# Patient Record
Sex: Female | Born: 1955
Health system: Southern US, Community
[De-identification: ages and names within clinical notes are randomized; demographics above are authoritative.]

## PROBLEM LIST (undated history)

## (undated) DIAGNOSIS — F329 Major depressive disorder, single episode, unspecified: Principal | ICD-10-CM

## (undated) DIAGNOSIS — G44229 Chronic tension-type headache, not intractable: Secondary | ICD-10-CM

## (undated) DIAGNOSIS — F4323 Adjustment disorder with mixed anxiety and depressed mood: Secondary | ICD-10-CM

## (undated) HISTORY — DX: Adjustment disorder with mixed anxiety and depressed mood: F43.23

## (undated) HISTORY — DX: Chronic tension-type headache, not intractable: G44.229

## (undated) HISTORY — DX: Major depressive disorder, single episode, unspecified: F32.9

---
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
## 1987-12-10 HISTORY — PX: BREAST ENHANCEMENT SURGERY: SHX7

## 2001-12-09 HISTORY — PX: OTHER SURGICAL HISTORY: SHX169

## 2011-07-01 DIAGNOSIS — F32A Depression, unspecified: Secondary | ICD-10-CM

## 2011-07-01 HISTORY — DX: Depression, unspecified: F32.A

## 2011-09-24 ENCOUNTER — Other Ambulatory Visit: Payer: Self-pay | Admitting: Obstetrics and Gynecology

## 2011-09-24 DIAGNOSIS — Z1231 Encounter for screening mammogram for malignant neoplasm of breast: Secondary | ICD-10-CM

## 2011-10-24 ENCOUNTER — Ambulatory Visit
Admission: RE | Admit: 2011-10-24 | Discharge: 2011-10-24 | Disposition: A | Discharge status: Home / Self Care | Payer: BC Managed Care – PPO | Source: Ambulatory Visit | Attending: Obstetrics and Gynecology | Admitting: Obstetrics and Gynecology

## 2011-10-24 DIAGNOSIS — Z1231 Encounter for screening mammogram for malignant neoplasm of breast: Secondary | ICD-10-CM

## 2012-09-25 ENCOUNTER — Encounter: Payer: Self-pay | Admitting: Obstetrics and Gynecology

## 2012-09-25 ENCOUNTER — Ambulatory Visit (INDEPENDENT_AMBULATORY_CARE_PROVIDER_SITE_OTHER): Payer: BC Managed Care – PPO | Admitting: Obstetrics and Gynecology

## 2012-09-25 VITALS — BP 120/80 | HR 72 | Resp 16 | Ht 64.0 in | Wt 159.0 lb

## 2012-09-25 DIAGNOSIS — Z01419 Encounter for gynecological examination (general) (routine) without abnormal findings: Secondary | ICD-10-CM

## 2012-09-25 NOTE — Progress Notes (Signed)
Contraception Postmenopausal Last pap 09/24/2011 WNL Last Mammo 10/24/2011 WNL Last Colonoscopy 2001 WNL (Polyps-benign) Last Dexa Scan None Primary MD Pennelope Bracken Abuse at Home None   Filed Vitals:   09/25/12 1015  BP: 120/80  Pulse: 72  Resp: 16   ROS: noncontributory  Physical Examination: General appearance - alert, well appearing, and in no distress Neck - supple, no significant adenopathy Chest - clear to auscultation, no wheezes, rales or rhonchi, symmetric air entry Heart - normal rate and regular rhythm Abdomen - soft, nontender, nondistended, no masses or organomegaly Breasts - breasts appear normal, no suspicious masses, no skin or nipple changes or axillary nodes Pelvic - normal external genitalia, vulva, vagina, cervix, uterus and adnexa Rectal - no masses Back exam - no CVAT Extremities - no edema, redness or tenderness in the calves or thighs  A/P No pap today - last pap last yr mammo Refer to Dr Loreta Ave (GI) for coloscopy - last one 12 yrs ago

## 2012-11-13 ENCOUNTER — Telehealth: Payer: Self-pay | Admitting: Obstetrics and Gynecology

## 2012-11-13 NOTE — Telephone Encounter (Signed)
AR pt 

## 2012-11-19 ENCOUNTER — Other Ambulatory Visit: Payer: Self-pay | Admitting: Obstetrics and Gynecology

## 2012-11-19 NOTE — Telephone Encounter (Signed)
Kimberly Vang to address 

## 2012-11-20 NOTE — Telephone Encounter (Signed)
Spoke to pt who has questions about dosing on her hormones. I told her I'd run it by AR on Monday and get back with he with answers to her questions. Melody Comas A

## 2012-11-26 ENCOUNTER — Telehealth: Payer: Self-pay

## 2012-11-26 NOTE — Telephone Encounter (Signed)
Spoke to pt yesterday re: the question she had last week. She wanted to know if she could take 1/2 of her Estradiol and 1/2 of her Provera to see if that would control her mp sx's.  Per AR, she may do this. Pt needs RF's on both meds, in the meantime. 1 RF on both meds was called to patients preferred pharmacy. She will try this for 6-8 weeks and report back how it is working. Melody Comas A

## 2014-06-27 ENCOUNTER — Ambulatory Visit: Payer: 59 | Attending: Specialist

## 2014-06-27 DIAGNOSIS — M25569 Pain in unspecified knee: Secondary | ICD-10-CM | POA: Diagnosis not present

## 2014-06-27 DIAGNOSIS — R262 Difficulty in walking, not elsewhere classified: Secondary | ICD-10-CM | POA: Insufficient documentation

## 2014-06-27 DIAGNOSIS — IMO0001 Reserved for inherently not codable concepts without codable children: Secondary | ICD-10-CM | POA: Diagnosis not present

## 2014-06-30 ENCOUNTER — Ambulatory Visit: Payer: 59 | Admitting: Physical Therapy

## 2014-06-30 DIAGNOSIS — IMO0001 Reserved for inherently not codable concepts without codable children: Secondary | ICD-10-CM | POA: Diagnosis not present

## 2014-07-05 ENCOUNTER — Ambulatory Visit: Payer: 59 | Admitting: Physical Therapy

## 2014-07-05 DIAGNOSIS — IMO0001 Reserved for inherently not codable concepts without codable children: Secondary | ICD-10-CM | POA: Diagnosis not present

## 2014-07-07 ENCOUNTER — Ambulatory Visit: Payer: 59 | Admitting: Physical Therapy

## 2014-07-07 DIAGNOSIS — IMO0001 Reserved for inherently not codable concepts without codable children: Secondary | ICD-10-CM | POA: Diagnosis not present

## 2014-07-12 ENCOUNTER — Ambulatory Visit: Payer: 59 | Attending: Specialist | Admitting: Physical Therapy

## 2014-07-12 DIAGNOSIS — M25569 Pain in unspecified knee: Secondary | ICD-10-CM | POA: Insufficient documentation

## 2014-07-12 DIAGNOSIS — R262 Difficulty in walking, not elsewhere classified: Secondary | ICD-10-CM | POA: Insufficient documentation

## 2014-07-12 DIAGNOSIS — IMO0001 Reserved for inherently not codable concepts without codable children: Secondary | ICD-10-CM | POA: Insufficient documentation

## 2014-07-14 ENCOUNTER — Ambulatory Visit: Payer: 59

## 2014-07-14 DIAGNOSIS — IMO0001 Reserved for inherently not codable concepts without codable children: Secondary | ICD-10-CM | POA: Diagnosis not present

## 2014-07-18 ENCOUNTER — Ambulatory Visit: Payer: 59

## 2014-07-20 ENCOUNTER — Ambulatory Visit: Payer: 59

## 2014-07-20 DIAGNOSIS — IMO0001 Reserved for inherently not codable concepts without codable children: Secondary | ICD-10-CM | POA: Diagnosis not present

## 2014-07-26 ENCOUNTER — Ambulatory Visit: Payer: 59

## 2014-07-26 DIAGNOSIS — IMO0001 Reserved for inherently not codable concepts without codable children: Secondary | ICD-10-CM | POA: Diagnosis not present

## 2014-07-28 ENCOUNTER — Ambulatory Visit: Payer: 59

## 2014-07-28 DIAGNOSIS — IMO0001 Reserved for inherently not codable concepts without codable children: Secondary | ICD-10-CM | POA: Diagnosis not present

## 2014-08-08 ENCOUNTER — Ambulatory Visit: Payer: 59

## 2014-08-08 DIAGNOSIS — IMO0001 Reserved for inherently not codable concepts without codable children: Secondary | ICD-10-CM | POA: Diagnosis not present

## 2014-08-10 ENCOUNTER — Ambulatory Visit: Payer: 59 | Attending: Specialist | Admitting: Rehabilitation

## 2014-08-10 DIAGNOSIS — IMO0001 Reserved for inherently not codable concepts without codable children: Secondary | ICD-10-CM | POA: Diagnosis not present

## 2014-08-10 DIAGNOSIS — R262 Difficulty in walking, not elsewhere classified: Secondary | ICD-10-CM | POA: Diagnosis not present

## 2014-08-10 DIAGNOSIS — M25569 Pain in unspecified knee: Secondary | ICD-10-CM | POA: Diagnosis not present

## 2014-08-16 ENCOUNTER — Ambulatory Visit: Payer: 59

## 2014-08-16 DIAGNOSIS — IMO0001 Reserved for inherently not codable concepts without codable children: Secondary | ICD-10-CM | POA: Diagnosis not present

## 2014-08-18 ENCOUNTER — Ambulatory Visit: Payer: 59

## 2014-11-16 LAB — HM COLONOSCOPY

## 2015-06-23 ENCOUNTER — Encounter: Payer: Self-pay | Admitting: Dietician

## 2015-06-23 ENCOUNTER — Encounter: Payer: 59 | Attending: Family Medicine | Admitting: Dietician

## 2015-06-23 VITALS — Ht 64.5 in | Wt 160.0 lb

## 2015-06-23 DIAGNOSIS — Z713 Dietary counseling and surveillance: Secondary | ICD-10-CM | POA: Diagnosis not present

## 2015-06-23 DIAGNOSIS — I159 Secondary hypertension, unspecified: Secondary | ICD-10-CM | POA: Insufficient documentation

## 2015-06-23 DIAGNOSIS — Z6827 Body mass index (BMI) 27.0-27.9, adult: Secondary | ICD-10-CM | POA: Diagnosis not present

## 2015-06-23 DIAGNOSIS — E663 Overweight: Secondary | ICD-10-CM | POA: Diagnosis not present

## 2015-06-23 NOTE — Progress Notes (Signed)
Medical Nutrition Therapy:  Appt start time: 1000 end time:  1100.   Assessment:  Primary concerns today: Patient is here alone.  She would like to learn how to decrease blood pressure with diet and lose weight. She would also like ideas of things to pack for lunch.  She is in early menopause for the past 2-3 years.  Complains of 8-9 lb weight gain in the past 6 months.  UBW:  150 and feels most comfortable at 140 lbs.  (140 lbs 4-5 years ago.)  Feels like that she is sleeping well currently.    Patient lives with husband and son who is studying for the bar.  All share the cooking and shopping.  She is a controller for a small company.     TANITA  BODY COMP RESULTS 06/23/15   39.6%fat 156.5   BMI (kg/m^2) 26.4   Fat Mass (lbs) 62   Fat Free Mass (lbs) 94.5   Total Body Water (lbs) 69    Preferred Learning Style:   No preference indicated   Learning Readiness:   Ready  MEDICATIONS: see list to also include a beta blocker (pt did not bring meds and can"t remember the name.   DIETARY INTAKE: Patient stated that she felt that she was eating >1500 calories on a regular basis.  She is mindful with her intake overall.  Recently decreased sweet intake.  Has verbalized concerns of the caloric content of her wine intake. 24-hr recall:  B ( AM): 1 slice [employer] bread with almond butter and jelly and diet coke OR Nourish Special K cereal. Snk ( AM): unsweetened tea  L ( PM): From home:  Salad with deli meat and cheese OR chicken salad on lettuce OR cottage cheese and fruit OR out to eat Enchilada, salad, chips and salsa Snk ( PM): occasional Ritz bits PB crackers OR rice krispy treat OR small amount of chocolate D ( PM): sandwich on [employer] with [place], tomato, mayo, avocado, pickled okra, small portion potato chips Snk ( PM): small bowl of ice cream or ice cream sandwich Beverages: 2 glasses of wine 4 nights per week, diet coke, unsweetened tea, water, v-8 fusion on occasion  Usual physical activity:  ADL's.  Has a gym member ship but goes one time every 1-2 weeks and does 30-40 minutes of cycling or walking.  Takes dog for a walk.  Floor exercises 4-5 times per week.    Estimated energy needs: 1500 calories 60-70 gm protein  Progress Towards Goal(s):  In progress.   Nutritional Diagnosis:  NB-1.1 Food and nutrition-related knowledge deficit NB-1.2 Harmful beliefs/attitudes about food or nutrition-related topics (use with caution) As related to weight managment.  As evidenced by patient report and recent weight gain.    Intervention:  Nutrition Education/counseling related to weight management and the importance of increasing physical activity to increase metabolism and for overall health. Discussed basics of intuitive eating.  Also discussed basic low sodium (dash diet) for HTN..  Increase your physical activity.  Continue your floor work.  Find something that you enjoy.  Start the habit.  30 minutes most days is a good start. Continue to make mindful meal choices. Consider what you drink.  Adequate sleep. Decreased sodium intake and increased intake of potassium  (fruit and vegetables)  Lunch ideas to pack:    Fruit and cottage cheese   Salad with veges and protein, red beans  Leftovers  Whole grain crackers, sting cheese, fruit  Greek yogurt with fruit and  2 Tablespoons granola or 6 crackers  Soup and 1/2 sandwich and fruit  Sandwich and fruit   Steamer veges, yogurt, fruit  Raw veges, plain yogurt mixed with ranch seasoning, fruit, 6 whole grain crackers   Teaching Method Utilized:  Visual Auditory  Handouts given during visit include:    Barriers to learning/adherence to lifestyle change: none  Demonstrated degree of understanding via:  Teach Back   Monitoring/Evaluation:  Dietary intake, exercise, and body weight in 1 month(s).

## 2015-06-23 NOTE — Patient Instructions (Addendum)
Increase your physical activity.  Continue your floor work.  Find something that you enjoy.  Start the habit.  30 minutes most days is a good start. Continue to make mindful meal choices. Consider what you drink.  Adequate sleep. Decreased sodium intake and increased intake of potassium  (fruit and vegetables)  Lunch ideas to pack:    Fruit and cottage cheese   Salad with veges and protein, red beans  Leftovers  Whole grain crackers, sting cheese, fruit  Greek yogurt with fruit and 2 Tablespoons granola or 6 crackers  Soup and 1/2 sandwich and fruit  Sandwich and fruit   Steamer veges, yogurt, fruit  Raw veges, plain yogurt mixed with ranch seasoning, fruit, 6 whole grain crackers

## 2015-07-20 LAB — TSH: TSH: 1.88 u[IU]/mL (ref 0.41–5.90)

## 2015-07-20 LAB — HM PAP SMEAR: HM Pap smear: NEGATIVE

## 2015-07-20 LAB — LIPID PANEL
Cholesterol: 218 mg/dL — AB (ref 0–200)
HDL: 64 mg/dL (ref 35–70)
LDL Cholesterol: 133 mg/dL
Triglycerides: 106 mg/dL (ref 40–160)

## 2015-07-20 LAB — CBC AND DIFFERENTIAL
HCT: 44 % (ref 36–46)
Hemoglobin: 14.9 g/dL (ref 12.0–16.0)
Platelets: 240 10*3/uL (ref 150–399)
WBC: 5.6 10^3/mL

## 2015-07-20 LAB — BASIC METABOLIC PANEL
BUN: 14 mg/dL (ref 4–21)
Creatinine: 0.8 mg/dL (ref 0.5–1.1)
Glucose: 95 mg/dL
Potassium: 4.4 mmol/L (ref 3.4–5.3)

## 2015-07-20 LAB — HEPATIC FUNCTION PANEL
ALT: 18 U/L (ref 7–35)
AST: 20 U/L (ref 13–35)

## 2015-07-28 ENCOUNTER — Ambulatory Visit: Payer: 59 | Admitting: Dietician

## 2015-12-27 DIAGNOSIS — R079 Chest pain, unspecified: Secondary | ICD-10-CM | POA: Diagnosis not present

## 2015-12-27 MED FILL — PANTOPRAZOLE SOD DR 40 MG T: 40 | 30 days supply | Qty: 30 | Fill #0

## 2015-12-27 MED FILL — METOPROLOL SUCC ER 25 MG TA: 25 | 90 days supply | Qty: 90 | Fill #0

## 2015-12-28 ENCOUNTER — Other Ambulatory Visit (HOSPITAL_COMMUNITY): Payer: Self-pay | Admitting: Family Medicine

## 2015-12-28 DIAGNOSIS — R0602 Shortness of breath: Secondary | ICD-10-CM

## 2016-01-01 ENCOUNTER — Ambulatory Visit (HOSPITAL_COMMUNITY)
Admission: RE | Admit: 2016-01-01 | Discharge: 2016-01-01 | Disposition: A | Discharge status: Home / Self Care | Payer: 59 | Source: Ambulatory Visit | Attending: Family Medicine | Admitting: Family Medicine

## 2016-01-01 DIAGNOSIS — R9439 Abnormal result of other cardiovascular function study: Secondary | ICD-10-CM | POA: Insufficient documentation

## 2016-01-01 DIAGNOSIS — R0602 Shortness of breath: Secondary | ICD-10-CM | POA: Diagnosis not present

## 2016-01-01 LAB — EXERCISE TOLERANCE TEST
Estimated workload: 8.5 METS
Exercise duration (min): 7 min
Exercise duration (sec): 0 s
MPHR: 161 {beats}/min
Peak HR: 160 {beats}/min
Percent HR: 99 %
RPE: 18
Rest HR: 74 {beats}/min

## 2016-01-01 NOTE — Progress Notes (Signed)
The patient presented for outpatient POET which was ordered by PCP. Dr. Debara Pickett to review.   Kimberly Vang, [place]

## 2016-01-12 ENCOUNTER — Ambulatory Visit (INDEPENDENT_AMBULATORY_CARE_PROVIDER_SITE_OTHER): Payer: 59 | Admitting: Cardiovascular Disease

## 2016-01-12 ENCOUNTER — Encounter: Payer: Self-pay | Admitting: Cardiovascular Disease

## 2016-01-12 VITALS — BP 114/62 | HR 64 | Ht 64.5 in | Wt 167.0 lb

## 2016-01-12 DIAGNOSIS — R9439 Abnormal result of other cardiovascular function study: Secondary | ICD-10-CM | POA: Diagnosis not present

## 2016-01-12 DIAGNOSIS — R079 Chest pain, unspecified: Secondary | ICD-10-CM | POA: Insufficient documentation

## 2016-01-12 DIAGNOSIS — R0789 Other chest pain: Secondary | ICD-10-CM

## 2016-01-12 DIAGNOSIS — I10 Essential (primary) hypertension: Secondary | ICD-10-CM

## 2016-01-12 NOTE — Patient Instructions (Addendum)
**Note De-Identified Kimberly Vang Obfuscation** Medication Instructions:  Same-no changes  Labwork: None  Testing/Procedures: Your physician has requested that you have a lexiscan myoview. For further information please visit [email or web address]. Please follow instruction sheet, as given.    Follow-Up: Your physician recommends that you schedule a follow-up appointment in: as needed      If you need a refill on your cardiac medications before your next appointment, please call your pharmacy.

## 2016-01-12 NOTE — Progress Notes (Signed)
Cardiology Office Note   Date:  01/12/2016   ID:  Kimberly Vang, DOB Feb 16, 1956, MRN [id number]  PCP:  Ronita Hipps, MD  Cardiologist:   Thayer Headings, MD   Chief Complaint  Patient presents with  . Chest Pain   Problem List 1. Chest pain  2. Essential HTN   History of Present Illness: Kimberly Vang is a 60 y.o. female who presents for evaluation of chest pain  Has had CP for the past year.  Pressure like sensation ,  Middle of her chest , Fairly constant for the past year . Not associated with sweats or dysnea. Not worsened with exertion . She does not get regular exercise.   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  Lots of stress associated with the job.    Her pains seem to be a little better on the weekends  Her metoprolol XL was decreased recently .    Has gained about 20 lbs over the past 4 years.   She had a regular stress test in the hospital several weeks ago which was positive. She reportedly had 2-3 mm of ST segment depression in the anterior leads.   I was not able to pull up the EKGs from the stress test. She did not have any worsening chest pain during the stress test.  No past medical history on file.  Past Surgical History  Procedure Laterality Date  . Tonsiectomy  1965  . Left wrist surgery  2003  . Breast enhancement surgery  1989     Current Outpatient Prescriptions  Medication Sig Dispense Refill  . escitalopram (LEXAPRO) 10 MG tablet Take 10 mg by mouth daily.  0  . LORazepam (ATIVAN) 0.5 MG tablet Take 0.5 mg by mouth every 8 (eight) hours as needed for anxiety.    . metoprolol succinate (TOPROL-XL) 25 MG 24 hr tablet Take 25 mg by mouth daily.  1   No current facility-administered medications for this visit.    Allergies:   Review of patient's allergies indicates no known allergies.    Social History:  The patient  reports that she has never smoked. She does not have any smokeless tobacco history on file. She reports that she drinks  alcohol. She reports that she does not use illicit drugs.   Family History:  The patient's family history includes Arthritis in her mother; Coronary artery disease in her father; Transient ischemic attack in her father.    ROS:  Please see the history of present illness.    Review of Systems: Constitutional:  denies fever, chills, diaphoresis, appetite change and fatigue.  HEENT: denies photophobia, eye pain, redness, hearing loss, ear pain, congestion, sore throat, rhinorrhea, sneezing, neck pain, neck stiffness and tinnitus.  Respiratory: denies SOB, DOE, cough, chest tightness, and wheezing.  Cardiovascular: denies chest pain, palpitations and leg swelling.  Gastrointestinal: denies nausea, vomiting, abdominal pain, diarrhea, constipation, blood in stool.  Genitourinary: denies dysuria, urgency, frequency, hematuria, flank pain and difficulty urinating.  Musculoskeletal: denies  myalgias, back pain, joint swelling, arthralgias and gait problem.   Skin: denies pallor, rash and wound.  Neurological: denies dizziness, seizures, syncope, weakness, light-headedness, numbness and headaches.   Hematological: denies adenopathy, easy bruising, personal or family bleeding history.  Psychiatric/ Behavioral: denies suicidal ideation, mood changes, confusion, nervousness, sleep disturbance and agitation.       All other systems are reviewed and negative.    PHYSICAL EXAM: VS:  BP 114/62 mmHg  Pulse 64  Ht  5' 4.5" (1.638 m)  Wt 167 lb (75.751 kg)  BMI 28.23 kg/m2  LMP 10/16/2011 , BMI Body mass index is 28.23 kg/(m^2). GEN: Well nourished, well developed, in no acute distress HEENT: normal Neck: no JVD, carotid bruits, or masses Cardiac: RRR; no murmurs, rubs, or gallops,no edema  Respiratory:  clear to auscultation bilaterally, normal work of breathing GI: soft, nontender, nondistended, + BS MS: no deformity or atrophy Skin: warm and dry, no rash Neuro:  Strength and sensation are  intact Psych: normal   EKG:  EKG is ordered today. The ekg ordered today demonstrates  NSR at 64.   TWI in leads V1-V3.    Recent Labs: No results found for requested labs within last 365 days.    Lipid Panel No results found for: CHOL, TRIG, HDL, CHOLHDL, VLDL, LDLCALC, LDLDIRECT    Wt Readings from Last 3 Encounters:  01/12/16 167 lb (75.751 kg)  06/23/15 160 lb (72.576 kg)  09/25/12 159 lb (72.122 kg)      Other studies Reviewed: Additional studies/ records that were reviewed today include: .GXT from 2 weeks ago .  Review of the above records demonstrates:  Abnormal stress test, - 2-3 mm ST depression    ASSESSMENT AND PLAN:  1. Chest discomfort: Gillermina presents with a year history of chest discomfort. The pain is not exacerbated by eating, drinking, change of position, or exercise. The pain is fairly constant. It is it is described as a pressure. She had a stress test which revealed ST segment depression and was read out as positive.  I think that her chest pains are very atypical and I think the likelihood of her having significant coronary artery disease is low but now we have a stress test that is abnormal that we need to investigate further. I would like to do a [employer] study for further evaluation.  I think that her chest pains are more likely related to stress. I've recommended that she start a walking program and exercise regularly . This should also help with weight loss and her blood pressure control.  I'll see her back on an as-needed basis.  Current medicines are reviewed at length with the patient today.  The patient does not have concerns regarding medicines.  The following changes have been made:  no change  Labs/ tests ordered today include:  No orders of the defined types were placed in this encounter.     Disposition:   FU with me as needed.      Nahser, Wonda Cheng, MD  01/12/2016 8:46 AM    [facility] [place], [place], [place]  [address] Phone: [phone]; Fax: [phone]   [facility]  [address] [place], [place]  [address] [phone]   Fax [phone]

## 2016-01-16 ENCOUNTER — Telehealth: Payer: Self-pay | Admitting: Cardiovascular Disease

## 2016-01-16 NOTE — Telephone Encounter (Signed)
Spoke with patient who wanted to be certain that nuclear stress test is necessary.  She states she was not certain Dr. Acie Fredrickson had seen the previous stress test ordered by her PCP.  I advised her that per Dr. Acie Fredrickson, she had ST depression on GXT and more definitive test is needed.  She verbalized understanding and agreement and is scheduled for 2/15.  She thanked me for the call.

## 2016-01-16 NOTE — Telephone Encounter (Signed)
New message      Talk to someone about the nuclear stress scheduled.  She is not sure she needs this test

## 2016-01-18 ENCOUNTER — Encounter (HOSPITAL_COMMUNITY): Payer: 59

## 2016-01-18 ENCOUNTER — Telehealth (HOSPITAL_COMMUNITY): Payer: Self-pay | Admitting: *Deleted

## 2016-01-18 NOTE — Telephone Encounter (Signed)
Patient given detailed instructions per Myocardial Perfusion Study Information Sheet for the test on 01/24/16 at 730. Patient notified to arrive 15 minutes early and that it is imperative to arrive on time for appointment to keep from having the test rescheduled.  If you need to cancel or reschedule your appointment, please call the office within 24 hours of your appointment. Failure to do so may result in a cancellation of your appointment, and a $50 no show fee. Patient verbalized understanding.Hubbard Robinson, RN

## 2016-01-24 ENCOUNTER — Ambulatory Visit (HOSPITAL_COMMUNITY): Payer: 59 | Attending: Cardiovascular Disease

## 2016-01-24 DIAGNOSIS — R0609 Other forms of dyspnea: Secondary | ICD-10-CM | POA: Insufficient documentation

## 2016-01-24 DIAGNOSIS — R9439 Abnormal result of other cardiovascular function study: Secondary | ICD-10-CM | POA: Insufficient documentation

## 2016-01-24 DIAGNOSIS — I10 Essential (primary) hypertension: Secondary | ICD-10-CM | POA: Insufficient documentation

## 2016-01-24 DIAGNOSIS — R0789 Other chest pain: Secondary | ICD-10-CM | POA: Insufficient documentation

## 2016-01-24 DIAGNOSIS — R002 Palpitations: Secondary | ICD-10-CM | POA: Insufficient documentation

## 2016-01-24 LAB — MYOCARDIAL PERFUSION IMAGING
LV dias vol: 81 mL
LV sys vol: 36 mL
Peak HR: 90 {beats}/min
RATE: 0.29
Rest HR: 57 {beats}/min
SDS: 2
SRS: 5
SSS: 7
TID: 1.11

## 2016-01-24 MED ORDER — REGADENOSON 0.4 MG/5ML IV SOLN
0.4000 mg | Freq: Once | INTRAVENOUS | Status: AC
  Administered 2016-01-24: 0.4 mg via INTRAVENOUS

## 2016-01-24 MED ORDER — TECHNETIUM TC 99M SESTAMIBI GENERIC - CARDIOLITE
10.8000 | Freq: Once | INTRAVENOUS | Status: AC | PRN
  Administered 2016-01-24: 11 via INTRAVENOUS

## 2016-01-24 MED ORDER — TECHNETIUM TC 99M SESTAMIBI GENERIC - CARDIOLITE
32.8000 | Freq: Once | INTRAVENOUS | Status: AC | PRN
  Administered 2016-01-24: 32.8 via INTRAVENOUS

## 2016-02-05 MED FILL — RESTASIS 0.05% EYE EMULSION: 0.05 | 90 days supply | Qty: 180 | Fill #0

## 2016-02-08 DIAGNOSIS — L659 Nonscarring hair loss, unspecified: Secondary | ICD-10-CM | POA: Diagnosis not present

## 2016-02-08 DIAGNOSIS — R5383 Other fatigue: Secondary | ICD-10-CM | POA: Diagnosis not present

## 2016-02-08 LAB — HEPATIC FUNCTION PANEL
ALT: 18 U/L (ref 7–35)
AST: 19 U/L (ref 13–35)
Alkaline Phosphatase: 70 U/L (ref 25–125)
Bilirubin, Total: 0.9 mg/dL

## 2016-02-08 LAB — BASIC METABOLIC PANEL
BUN: 11 mg/dL (ref 4–21)
Creatinine: 0.8 mg/dL (ref 0.5–1.1)
Glucose: 100 mg/dL
Potassium: 4 mmol/L (ref 3.4–5.3)
Sodium: 141 mmol/L (ref 137–147)

## 2016-02-08 LAB — CBC AND DIFFERENTIAL
HCT: 43 % (ref 36–46)
Hemoglobin: 14.6 g/dL (ref 12.0–16.0)
Platelets: 273 10*3/uL (ref 150–399)
WBC: 6.2 10^3/mL

## 2016-02-08 LAB — T4, FREE: T4,Free (Direct): 1.02

## 2016-02-08 LAB — TSH: TSH: 1.9 u[IU]/mL (ref 0.41–5.90)

## 2016-02-14 MED FILL — LORazepam 0.5 MG TABS: 0.5 | 90 days supply | Qty: 90 | Fill #0

## 2016-02-27 MED FILL — ESCITALOPRAM 10 MG TABLET: 10 | 90 days supply | Qty: 90 | Fill #0

## 2016-03-26 ENCOUNTER — Ambulatory Visit (HOSPITAL_COMMUNITY)
Admission: EM | Admit: 2016-03-26 | Discharge: 2016-03-26 | Disposition: A | Discharge status: Home / Self Care | Payer: 59 | Attending: Family Medicine | Admitting: Family Medicine

## 2016-03-26 ENCOUNTER — Encounter (HOSPITAL_COMMUNITY): Payer: Self-pay | Admitting: *Deleted

## 2016-03-26 ENCOUNTER — Ambulatory Visit (INDEPENDENT_AMBULATORY_CARE_PROVIDER_SITE_OTHER): Payer: 59

## 2016-03-26 DIAGNOSIS — S82092A Other fracture of left patella, initial encounter for closed fracture: Secondary | ICD-10-CM | POA: Diagnosis not present

## 2016-03-26 DIAGNOSIS — S82002A Unspecified fracture of left patella, initial encounter for closed fracture: Secondary | ICD-10-CM

## 2016-03-26 NOTE — Discharge Instructions (Signed)
Take 1 full strength aspirin 325mg  every day, start tonight, wear brace when walking , use ice pack for soreness and swelling, see dr Veverly Fells on wed or thurs for recheck.

## 2016-03-26 NOTE — ED Notes (Signed)
16  Inch    Knee  Immobilizer  Applied    

## 2016-03-26 NOTE — ED Notes (Signed)
Pt  Reports     l  Knee       She  Reports  She  Felled  On the  Knee     Has  Some  Pain  Present       she  Reports  Pain on  Weight  Bearing  And       Pain on  rom

## 2016-03-26 NOTE — ED Provider Notes (Signed)
CSN: [id number]     Arrival date & time 03/26/16  1633 History   First MD Initiated Contact with Patient 03/26/16 1751     Chief Complaint  Patient presents with  . Knee Injury   (Consider location/radiation/quality/duration/timing/severity/associated sxs/prior Treatment) Patient is a 60 y.o. female presenting with knee pain. The history is provided by the patient and the spouse.  Knee Pain Location:  Knee Time since incident:  8 hours Injury: yes   Mechanism of injury: fall   Mechanism of injury comment:  Fell against concrete stair at work this am Fall:    Impact surface:  Product manager of impact:  Knees   Entrapped after fall: no   Knee location:  L knee Pain details:    Quality:  Sharp   Radiates to:  Does not radiate   Severity:  Moderate   Onset quality:  Sudden Chronicity:  New Dislocation: no   Prior injury to area:  No Relieved by:  None tried Associated symptoms: no back pain     History reviewed. No pertinent past medical history. Past Surgical History  Procedure Laterality Date  . Tonsiectomy  1965  . Left wrist surgery  2003  . Breast enhancement surgery  1989   Family History  Problem Relation Age of Onset  . Arthritis Mother   . Coronary artery disease Father   . Transient ischemic attack Father    Social History  Substance Use Topics  . Smoking status: Never Smoker   . Smokeless tobacco: None  . Alcohol Use: Yes   OB History    No data available     Review of Systems  Musculoskeletal: Positive for gait problem. Negative for back pain and joint swelling.  Skin: Positive for wound.  All other systems reviewed and are negative.   Allergies  Review of patient's allergies indicates no known allergies.  Home Medications   Prior to Admission medications   Medication Sig Start Date End Date Taking? Authorizing Provider  escitalopram (LEXAPRO) 10 MG tablet Take 10 mg by mouth daily. 11/01/15   Historical Provider, MD  LORazepam (ATIVAN)  0.5 MG tablet Take 0.5 mg by mouth every 8 (eight) hours as needed for anxiety.    Historical Provider, MD  metoprolol succinate (TOPROL-XL) 25 MG 24 hr tablet Take 25 mg by mouth daily. 12/27/15   Historical Provider, MD   Meds Ordered and Administered this Visit  Medications - No data to display  BP 153/78 mmHg  Pulse 65  Temp(Src) 97.9 F (36.6 C) (Oral)  Resp 16  SpO2 100%  LMP 10/16/2011 No data found.   Physical Exam  Constitutional: She is oriented to person, place, and time. She appears well-developed and well-nourished. No distress.  Musculoskeletal: She exhibits tenderness.       Left knee: She exhibits decreased range of motion, swelling, ecchymosis and abnormal patellar mobility. She exhibits no laceration, no erythema and normal meniscus. Tenderness found. Patellar tendon tenderness noted.       Legs: Neurological: She is alert and oriented to person, place, and time.  Nursing note and vitals reviewed.   ED Course  Procedures (including critical care time)  Labs Review Labs Reviewed - No data to display  Imaging Review Dg Knee Ap/lat W/sunrise Left  03/26/2016  CLINICAL DATA:  Acute left knee pain after fall down steps today. Initial encounter. EXAM: LEFT KNEE 3 VIEWS COMPARISON:  None. FINDINGS: Minimally displaced fracture is seen involving the inferior aspect of the patella.  No significant joint effusion is noted. Joint spaces are intact. IMPRESSION: Minimally displaced fracture involving inferior aspect of the patella. Electronically Signed   By: Marijo Conception, M.D.   On: 03/26/2016 18:15     Visual Acuity Review  Right Eye Distance:   Left Eye Distance:   Bilateral Distance:    Right Eye Near:   Left Eye Near:    Bilateral Near:         MDM   1. Patellar fracture, left, closed, initial encounter    Discussed with dr Veverly Fells, plans as noted.   Billy Fischer, MD 03/26/16 1900

## 2016-03-27 DIAGNOSIS — S82092A Other fracture of left patella, initial encounter for closed fracture: Secondary | ICD-10-CM | POA: Diagnosis not present

## 2016-03-28 MED FILL — HYDROCODON-APAP 5-325: 5-325 | 5 days supply | Qty: 60 | Fill #0

## 2016-04-24 DIAGNOSIS — S82092D Other fracture of left patella, subsequent encounter for closed fracture with routine healing: Secondary | ICD-10-CM | POA: Diagnosis not present

## 2016-05-21 MED FILL — ESCITALOPRAM 10 MG TABLET: 10 | 90 days supply | Qty: 90 | Fill #1

## 2016-05-21 MED FILL — LORazepam 0.5 MG TABS: 0.5 | 90 days supply | Qty: 90 | Fill #0

## 2016-05-22 DIAGNOSIS — S82092D Other fracture of left patella, subsequent encounter for closed fracture with routine healing: Secondary | ICD-10-CM | POA: Diagnosis not present

## 2016-06-20 ENCOUNTER — Ambulatory Visit (INDEPENDENT_AMBULATORY_CARE_PROVIDER_SITE_OTHER): Payer: 59 | Admitting: Family Medicine

## 2016-06-20 ENCOUNTER — Encounter: Payer: Self-pay | Admitting: Family Medicine

## 2016-06-20 DIAGNOSIS — F329 Major depressive disorder, single episode, unspecified: Secondary | ICD-10-CM

## 2016-06-20 DIAGNOSIS — F32A Depression, unspecified: Secondary | ICD-10-CM

## 2016-06-20 DIAGNOSIS — G44229 Chronic tension-type headache, not intractable: Secondary | ICD-10-CM | POA: Diagnosis not present

## 2016-06-20 DIAGNOSIS — F430 Acute stress reaction: Secondary | ICD-10-CM

## 2016-06-20 DIAGNOSIS — F4323 Adjustment disorder with mixed anxiety and depressed mood: Secondary | ICD-10-CM

## 2016-06-20 MED ORDER — ESCITALOPRAM OXALATE 20 MG PO TABS: ORAL_TABLET | ORAL | Status: DC

## 2016-06-20 NOTE — Patient Instructions (Signed)
Drink at least 5 bottles of water daily. Next  Patient will get [place] old labs from her other doctor who just recently obtained a bunch of them. We will await those and then determine what new labs patient needs in the very near future and then make a follow-up to discuss those. We will wait to hear from patient regarding those labs

## 2016-06-20 NOTE — Progress Notes (Signed)
Kimberly Vang, D.O. Primary care at [facility]:    Chief Complaint  Patient presents with  . Establish Care   New pt, here to establish care.   HPI: Kimberly Vang is a pleasant 60 y.o. female who presents to [facility] today To establish as a new patient. She comes in complaining of fatigue and headaches which she's had for some time now. If this time she's also like to discuss arthritis.  She's been stress lately which she thinks is associated with causing her fatigue.  She also complains of a history of having pressure in her chest that started 2 years ago when she started her new job. 5 months ago she had a stress test and went to a cardiologist, whom she does not recall who was and apparently had a stress test which was negative. She does still have this chest pain from time to time.  She was on Prozac most of her life due to depression and then changed to Lexapro several years ago - about 3 years or so. Stable sx, No SI/ HI  Headaches onset 1 to 2 years, along with starting her new job. Her headaches are bifrontal and at the top of her head. They gave him wake her up at night. It is no particular time of day. She's always had a problem with headaches most of her life but have increased intensity and frequency the past 2 years with the onset of her new job. For the past 6 months they've been worse with increasing stress in her life since March. She has no prodrome, no neck pain, no muscle tightness, no visual changes or neurological symptoms. Ex  She tried 3 Advil with little to no help it does dull it. Tylenol PM at night and uses Excedrin Migraine with some relief. Heavy caffeine use during the day. Very little water of only 30 ounces per day.   Past Medical History:  Diagnosis Date  . Adjustment disorder with mixed anxiety and depressed mood 06/30/2016  . Chronic tension headaches 06/30/2016  . Depression 07/01/2011       Past Surgical History:  Procedure Laterality Date  . BREAST ENHANCEMENT SURGERY  1989  . left wrist surgery  2003  . tonsiectomy  1965      Family History  Problem Relation Age of Onset  . Arthritis Mother   . Coronary artery disease Father   . Transient ischemic attack Father   . Cancer Father   . Stroke Father   . Cancer Sister       History  Drug Use No     History  Alcohol Use  . 6.0 oz/week  . 10 Glasses of wine per week     History  Smoking Status  . Never Smoker  Smokeless Tobacco  . Not on file       History  Sexual Activity  . Sexual activity: Yes  . Birth control/ protection: Post-menopausal, None      Patient's Medications  New Prescriptions   ESCITALOPRAM (LEXAPRO) 20 MG TABLET    One tab daily  Previous Medications   LORAZEPAM (ATIVAN) 0.5 MG TABLET    Take 0.5 mg by mouth every 8 (eight) hours as needed for anxiety.  Modified Medications   No medications on file  Discontinued Medications   ESCITALOPRAM (LEXAPRO) 10 MG TABLET    Take 10 mg by mouth daily.   METOPROLOL  SUCCINATE (TOPROL-XL) 25 MG 24 HR TABLET    Take 25 mg by mouth daily.     Review of patient's allergies indicates no known allergies.   Fall Risk  06/23/2015  Falls in the past year? No     Depression screen PHQ 2/9 06/23/2015  Decreased Interest 0  Down, Depressed, Hopeless 0  PHQ - 2 Score 0      Review of Systems:   ( Completed via Adult Medical History Intake form today ) General:   Denies fever, chills, appetite changes, unexplained weight loss.  Optho/Auditory:   Denies visual changes, blurred vision/LOV, ringing in ears/ diff hearing Respiratory:   Denies SOB, DOE, cough, wheezing.  Cardiovascular:   Denies chest pain, palpitations, new onset peripheral edema  Gastrointestinal:   Denies nausea, vomiting, diarrhea.  Genitourinary:    Denies dysuria, increased frequency, flank pain.  Endocrine:     Denies hot or cold intolerance, polyuria,  polydipsia. Musculoskeletal:  Denies unexplained myalgias, joint swelling, arthralgias, gait problems.  Skin:  Denies rash, suspicious lesions or new/ changes in moles Neurological:    Denies dizziness, syncope, unexplained weakness, lightheadedness, numbness  Psychiatric/Behavioral:   Denies mood changes, suicidal or homicidal ideations, hallucinations    Objective:   Blood pressure 137/87, pulse 70, height 5' 4.25" (1.632 m), weight 165 lb 1.6 oz (74.889 kg), last menstrual period 10/16/2011. Body mass index is 28.12 kg/m.  General: Well Developed, well nourished, and in no acute distress.  Neuro: Alert and oriented x3, extra-ocular muscles intact, sensation grossly intact.  HEENT: Normocephalic, atraumatic, pupils equal round reactive to light, neck supple, no gross masses, no carotid bruits, no JVD apprec Skin: no gross suspicious lesions or rashes  Cardiac: Regular rate and rhythm, no murmurs rubs or gallops.  Respiratory: Essentially clear to auscultation bilaterally. Not using accessory muscles, speaking in full sentences.  Abdominal: Soft, not grossly distended Musculoskeletal: Ambulates w/o diff, FROM * 4 ext.  Vasc: less 2 sec cap RF, warm and pink  Psych:  No HI/SI, judgement and insight good.    Impression and Recommendations:    1. Depression   2. Chronic tension-type headache, not intractable   3. Acute reaction to stress   4. Adjustment disorder with mixed anxiety and depressed mood    Discussed with patient we will increase her Lexapro from 10 mg to 20.. She'll follow up me in the near future to see if this has improved her symptoms.   We discussed stress management techniques and to increase activity levels. We determined that this is likely the cause of her headaches, her increased stress levels. I advised her also to increase her fluid intake to approximately one half of her weight in ounces water per day..  In future we will address her difficulty with sleep  with additional medications as needed.  She will get me her recent labs and we will determine what else needs to be ordered.   The patient was counseled, risk factors were discussed, anticipatory guidance given.  Meds ordered this encounter  Medications  . escitalopram (LEXAPRO) 20 MG tablet    Sig: One tab daily    Dispense:  30 tablet    Refill:  0    Gross side effects, risk and benefits, and alternatives of medications discussed with patient.  Patient is aware that all medications have potential side effects and we are unable to predict every side effect or drug-drug interaction that may occur.  Expresses verbal understanding and consents to current therapy  plan and treatment regimen.  Please see AVS handed out to patient at the end of our visit for further patient instructions/ counseling done pertaining to today's office visit.     Note: This document was prepared using Dragon voice recognition software and may include unintentional dictation errors.

## 2016-06-21 IMAGING — NM NM MISC PROCEDURE
3 series · 18 of 18 positions shown · non-contrast
Comparison: none

[Series 1: stress-gsp_(id)_sa · 6.4mm · 6.40mm/px · 6 of 512 frames shown]
[frame 43/512]
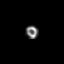
[frame 128/512]
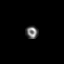
[frame 214/512]
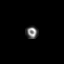
[frame 299/512]
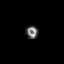
[frame 384/512]
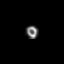
[frame 470/512]
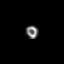

[Series 1: stress-sum-em_(id)_sa · 6.4mm · 6.40mm/px · 6 of 64 frames shown]
[frame 6/64]
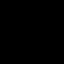
[frame 16/64]
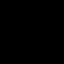
[frame 27/64]
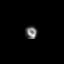
[frame 38/64]
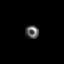
[frame 48/64]
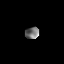
[frame 59/64]
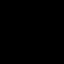

[Series 1: rest_(id)_sa · 6.4mm · 6.40mm/px · 6 of 64 frames shown]
[frame 6/64]
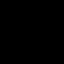
[frame 16/64]
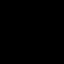
[frame 27/64]
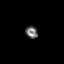
[frame 38/64]
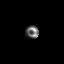
[frame 48/64]
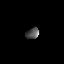
[frame 59/64]
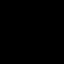

[18 of 18 positions shown; findings below may reference images not displayed]

Canned report from images found in remote index.

Refer to host system for actual result text.

## 2016-06-28 ENCOUNTER — Encounter: Payer: Self-pay | Admitting: Family Medicine

## 2016-06-30 ENCOUNTER — Encounter: Payer: Self-pay | Admitting: Family Medicine

## 2016-06-30 DIAGNOSIS — G44229 Chronic tension-type headache, not intractable: Secondary | ICD-10-CM

## 2016-06-30 DIAGNOSIS — F4323 Adjustment disorder with mixed anxiety and depressed mood: Secondary | ICD-10-CM | POA: Insufficient documentation

## 2016-06-30 HISTORY — DX: Chronic tension-type headache, not intractable: G44.229

## 2016-06-30 HISTORY — DX: Adjustment disorder with mixed anxiety and depressed mood: F43.23

## 2016-07-09 MED FILL — ESCITALOPRAM 20 MG TABLET: 20 | 30 days supply | Qty: 30 | Fill #0

## 2016-07-10 ENCOUNTER — Ambulatory Visit (INDEPENDENT_AMBULATORY_CARE_PROVIDER_SITE_OTHER): Payer: 59 | Admitting: Family Medicine

## 2016-07-10 ENCOUNTER — Encounter: Payer: Self-pay | Admitting: Family Medicine

## 2016-07-10 VITALS — BP 116/75 | HR 79 | Wt 164.1 lb

## 2016-07-10 DIAGNOSIS — E663 Overweight: Secondary | ICD-10-CM

## 2016-07-10 DIAGNOSIS — F4323 Adjustment disorder with mixed anxiety and depressed mood: Secondary | ICD-10-CM

## 2016-07-10 DIAGNOSIS — M791 Myalgia: Secondary | ICD-10-CM | POA: Diagnosis not present

## 2016-07-10 DIAGNOSIS — G479 Sleep disorder, unspecified: Secondary | ICD-10-CM

## 2016-07-10 DIAGNOSIS — G44229 Chronic tension-type headache, not intractable: Secondary | ICD-10-CM

## 2016-07-10 DIAGNOSIS — IMO0001 Reserved for inherently not codable concepts without codable children: Secondary | ICD-10-CM | POA: Insufficient documentation

## 2016-07-10 DIAGNOSIS — M609 Myositis, unspecified: Secondary | ICD-10-CM

## 2016-07-10 MED ORDER — ESCITALOPRAM OXALATE 20 MG PO TABS: ORAL_TABLET | ORAL | 1 refills | Status: DC

## 2016-07-10 MED ORDER — CYCLOBENZAPRINE HCL 10 MG PO TABS: 10.0000 mg | ORAL_TABLET | Freq: Three times a day (TID) | ORAL | 0 refills | Status: DC | PRN

## 2016-07-10 MED FILL — CYCLOBENZAPRINE 10 MG TAB: 10 | 10 days supply | Qty: 30 | Fill #0

## 2016-07-10 NOTE — Progress Notes (Signed)
Impression and Recommendations:    1. Adjustment disorder with mixed anxiety and depressed mood   2. Myalgia and myositis   3. Chronic tension-type headache, not intractable   4. Overweight (BMI 25.0-29.9)   5. Sleeping difficulties- due to stress     Sleeping difficulties- due to stress Advised proper sleep hygiene; sleeping meditation from YouTube at night before bed; melatonin 5-10 mg sublingual nightly.  Adjustment disorder with mixed anxiety and depressed mood Continue Lexapro at current dose.   Patient does not wish to make any changes and is very pleased.  Overweight (BMI 25.0-29.9) Prudent diet and exercise counseling done. Handouts given.  Chronic tension headaches Headaches now improved with her decrease in stress. Flexeril prescription renewed for patient.  Myalgia and myositis Flexeril prescription given after risks and benefits discussed.      Patient's Medications  New Prescriptions   CYCLOBENZAPRINE (FLEXERIL) 10 MG TABLET    Take 1 tablet (10 mg total) by mouth 3 (three) times daily as needed for muscle spasms.  Previous Medications   LORAZEPAM (ATIVAN) 0.5 MG TABLET    Take 0.5 mg by mouth every 8 (eight) hours as needed for anxiety.  Modified Medications   Modified Medication Previous Medication   ESCITALOPRAM (LEXAPRO) 20 MG TABLET escitalopram (LEXAPRO) 20 MG tablet      One tab daily    One tab daily  Discontinued Medications   No medications on file    Return in about 6 months (around 01/10/2017) for Follow-up of current medical issues.  The patient was counseled, risk factors were discussed, anticipatory guidance given.  Gross side effects, risk and benefits, and alternatives of medications discussed with patient.  Patient is aware that all medications have potential side effects and we are unable to predict every side effect or drug-drug interaction that may occur.  Expresses verbal understanding and consents to current therapy plan and  treatment regimen.  Please see AVS handed out to patient at the end of our visit for further patient instructions/ counseling done pertaining to today's office visit.    Note: This document was prepared using Dragon voice recognition software and may include unintentional dictation errors.   --------------------------------------------------------------------------------------------------------------------------------------------------------------------------------------------------------------------------------------------    Subjective:    CC:  Chief Complaint  Patient presents with  . Depression    HPI: Kimberly Vang is a 60 y.o. female who presents to [place] at [facility] today for issues as discussed below.  Patient requesting a refill of her cyclobenzaprine she brought in a bottle dated 07/31/2015 was her last refill. She at times gets B/l upper traps and paracervical muscle spasms- which then can lead to her tension headaches.  This medicine works well to help her muscles relax and HER-2 sleep at night. Patient tolerates this medicine well and has no side effects.   Patient started on Lexapro last office visit.  Pt feeling a lot better.  Seldomly having panicky feelings.  Was able to start exercising again b/c pills helped give her energy. Started working out again- twice [employer].  HA's almost gone as well.  Tol well w/o s-e.     Wt Readings from Last 3 Encounters:  07/10/16 164 lb 1.6 oz (74.4 kg)  06/20/16 165 lb 1.6 oz (74.9 kg)  01/24/16 167 lb (75.8 kg)   BP Readings from Last 3 Encounters:  07/10/16 116/75  06/20/16 137/87  03/26/16 153/78   Pulse Readings from Last 3 Encounters:  07/10/16 79  06/20/16  70  03/26/16 65     Patient Active Problem List   Diagnosis Date Noted  . Adjustment disorder with mixed anxiety and depressed mood 06/30/2016    Priority: High  . Overweight (BMI 25.0-29.9) 07/13/2016    Priority: Medium  . Sleeping  difficulties- due to stress 07/13/2016    Priority: Medium  . Myalgia and myositis 07/10/2016    Priority: Medium  . Chronic tension headaches 06/30/2016    Priority: Medium  . Chest pain 01/12/2016  . Abnormal stress test 01/12/2016    Past Medical history, Surgical history, Family history, Social history, Allergies and Medications have been entered into the medical record, reviewed and changed as needed.   Allergies:  No Known Allergies  Review of Systems: No fever/ chills, night sweats, no unintended weight loss, No chest pain, or increased shortness of breath. No N/V/D.  Pertinent positives and negatives noted in HPI above    Objective:   Blood pressure 116/75, pulse 79, weight 164 lb 1.6 oz (74.4 kg), last menstrual period 10/16/2011. Body mass index is 27.95 kg/m.  General: Well Developed, well nourished, appropriate for stated age.  Neuro: Alert and oriented x3, extra-ocular muscles intact, sensation grossly intact.  HEENT: Normocephalic, atraumatic, neck supple   Skin: Warm and dry, no gross rash. Cardiac: RRR, S1 S2,  no murmurs rubs or gallops.  Respiratory: ECTA B/L, Not using accessory muscles, speaking in full sentences-unlabored. Vascular:  No gross lower ext edema, cap RF less 2 sec. Psych: No SI/HI, Insight and judgement good

## 2016-07-10 NOTE — Patient Instructions (Addendum)
Guided meditation for sleep on YouTube.  Ed[address] and Gwynne Edinger      Top Ten Foods for Health  1. Water Drink at least 8 to 12 cups of water daily. Consume half of your body weight in pounds, is the amount of water in ounces to drink daily.  Ie: a 200lb person = 100 oz water daily  2. Dark Green Vegetables Eat dark green vegetables at least three to four times a week. Good options include broccoli, peppers, brussel sprouts and leafy greens like kale and spinach.  3. Whole Grains Whole grains should be included in your diet at least two to three times daily. Look for whole wheat flour, rye, oatmeal, barley, amaranth, quinoa or a multigrain. A good source of fiber includes 3 to 4 grams of fiber per serving. A great source has 5 or more grams of fiber per serving.  4. Beans and Lentils Try to eat a bean-based meal at least once a week. Try to add legumes, including beans and lentils, to soups, stews, casseroles, salads and dips or eat them plain.  5. Fish Try to eat two to three serving of fish a week. A serving consists of 3 to 4 ounces of cooked fish. Good choices are salmon, trout, herring, bluefish, sardines and tuna.  6. Berries Include two to four servings of fruit in your diet each day. Try to eat berries such as raspberries, blueberries, blackberries and strawberries.  7. Winter Squash Eat butternut and acorn squash as well as other richly pigmented dark orange and green colored vegetables like sweet potato, cantaloupe and mango.  8. Soy 25 grams of soy protein a day is recommended as part of a low-fat diet to help lower cholesterol levels. Try tofu, soymilk, edamame soybeans, tempeh and texturized vegetable protein (TVP).  9. Flaxseed, Nuts and Seeds Add 1 to 2 tablespoons of ground flaxseed or other seeds to food each day or include a moderate amount of nuts - 1/4 cup - in your daily diet.  10. Organic Yogurt Men and women between 62 and 94 years of age need  1000 milligrams of calcium a day and 1200 milligrams if 33 or older. Eat calcium-rich foods such as nonfat or low-fat dairy products three to four times a day. Include organic choices.      Adjustment Disorder Adjustment disorder is an unusually severe reaction to a stressful life event, such as the loss of a job or physical illness. The event may be any stressful event other than the loss of a loved one. Adjustment disorder may affect your feelings, your thinking, how you act, or a combination of these. It may interfere with personal relationships or with the way you are at work, school, or home. People with this disorder are at risk for suicide and substance abuse. They may develop a more serious mental disorder, such as major depressive disorder or post-traumatic stress disorder. SIGNS AND SYMPTOMS  Symptoms may include:  Sadness, depressed mood, or crying spells.  Loss of enjoyment.  Change in appetite or weight.  Sense of loss or hopelessness.  Thoughts of suicide.  Anxiety, worry, or nervousness.  Trouble sleeping.  Avoiding family and friends.  Poor school performance.  Fighting or vandalism.  Reckless driving.  Skipping school.  Poor work Systems analyst.  Ignoring bills. Symptoms of adjustment disorder start within 3 months of the stressful life event. They do not last more than 6 months after the event has ended. DIAGNOSIS  To make a diagnosis, your  health care provider will ask about what has happened in your life and how it has affected you. He or she may also ask about your medical history and use of medicines, alcohol, and other substances. Your health care provider may do a physical exam and order lab tests or other studies. You may be referred to a mental health specialist for evaluation. TREATMENT  Treatment options include:  Counseling or talk therapy. Talk therapy is usually provided by mental health specialists.  Medicine. Certain medicines may help with  depression, anxiety, and sleep.  Support groups. Support groups offer emotional support, advice, and guidance. They are made up of people who have had similar experiences. HOME CARE INSTRUCTIONS  Keep all follow-up visits as directed by your health care provider. This is important.  Take medicines only as directed by your health care provider. SEEK MEDICAL CARE IF:  Your symptoms get worse.  SEEK IMMEDIATE MEDICAL CARE IF: You have serious thoughts about hurting yourself or someone else. MAKE SURE YOU:  Understand these instructions.  Will watch your condition.  Will get help right away if you are not doing well or get worse.   This information is not intended to replace advice given to you by your health care provider. Make sure you discuss any questions you have with your health care provider.   Document Released: 07/30/2006 Document Revised: 12/16/2014 Document Reviewed: 04/19/2014 Elsevier Interactive Patient Education [employer].

## 2016-07-13 DIAGNOSIS — G479 Sleep disorder, unspecified: Secondary | ICD-10-CM | POA: Insufficient documentation

## 2016-07-13 DIAGNOSIS — E663 Overweight: Secondary | ICD-10-CM | POA: Insufficient documentation

## 2016-07-13 NOTE — Assessment & Plan Note (Signed)
Advised proper sleep hygiene; sleeping meditation from YouTube at night before bed; melatonin 5-10 mg sublingual nightly.

## 2016-07-13 NOTE — Assessment & Plan Note (Signed)
Continue Lexapro at current dose.   Patient does not wish to make any changes and is very pleased.

## 2016-07-13 NOTE — Assessment & Plan Note (Signed)
Flexeril prescription given after risks and benefits discussed.

## 2016-07-13 NOTE — Assessment & Plan Note (Signed)
Prudent diet and exercise counseling done. Handouts given.

## 2016-07-13 NOTE — Assessment & Plan Note (Signed)
Headaches now improved with her decrease in stress. Flexeril prescription renewed for patient.

## 2016-08-19 MED FILL — ESCITALOPRAM 20 MG TABLET: 20 | 90 days supply | Qty: 90 | Fill #0

## 2016-11-18 DIAGNOSIS — H524 Presbyopia: Secondary | ICD-10-CM | POA: Diagnosis not present

## 2016-11-18 MED FILL — LOTEMAX 0.5% GEL: 0.5 | 30 days supply | Qty: 5 | Fill #0

## 2016-12-06 ENCOUNTER — Other Ambulatory Visit: Payer: Self-pay

## 2016-12-06 ENCOUNTER — Telehealth: Payer: Self-pay

## 2016-12-06 NOTE — Telephone Encounter (Signed)
Pt wanted to see if she could change her physical apt on 09/09/17 due to her not feeling well. Informed pt that Dr. Raliegh Scarlet did not have an opening available before her surgery on 12/24/16. Informed pt that we had a new provider coming to our ov in January and if she would like to see her I could schedule with her.  Pt stated she was going to keep her apt on Dec 10, 2016. Informed pt if she still did not feel well to call us back and reschedule her apt. Bobetta Lime CMA, Rt

## 2016-12-10 ENCOUNTER — Encounter: Payer: Self-pay | Admitting: Family Medicine

## 2016-12-10 ENCOUNTER — Ambulatory Visit (INDEPENDENT_AMBULATORY_CARE_PROVIDER_SITE_OTHER): Payer: 59 | Admitting: Family Medicine

## 2016-12-10 VITALS — BP 130/84 | HR 71 | Ht 64.25 in | Wt 163.6 lb

## 2016-12-10 DIAGNOSIS — Z23 Encounter for immunization: Secondary | ICD-10-CM | POA: Diagnosis not present

## 2016-12-10 DIAGNOSIS — Z1231 Encounter for screening mammogram for malignant neoplasm of breast: Secondary | ICD-10-CM

## 2016-12-10 DIAGNOSIS — G479 Sleep disorder, unspecified: Secondary | ICD-10-CM

## 2016-12-10 DIAGNOSIS — E663 Overweight: Secondary | ICD-10-CM

## 2016-12-10 DIAGNOSIS — Z1211 Encounter for screening for malignant neoplasm of colon: Secondary | ICD-10-CM

## 2016-12-10 DIAGNOSIS — F4323 Adjustment disorder with mixed anxiety and depressed mood: Secondary | ICD-10-CM

## 2016-12-10 DIAGNOSIS — Z1239 Encounter for other screening for malignant neoplasm of breast: Secondary | ICD-10-CM

## 2016-12-10 DIAGNOSIS — Z0000 Encounter for general adult medical examination without abnormal findings: Secondary | ICD-10-CM

## 2016-12-10 MED ORDER — ESCITALOPRAM OXALATE 20 MG PO TABS
ORAL_TABLET | ORAL | 1 refills | Status: DC
Start: 2016-12-10 — End: 2017-11-04

## 2016-12-10 MED FILL — ESCITALOPRAM 20 MG TABLET: 20 | 90 days supply | Qty: 90 | Fill #1

## 2016-12-10 NOTE — Progress Notes (Signed)
Impression and Recommendations:    1. Screening for breast cancer   2. Need for Tdap vaccination   3. Overweight (BMI 25.0-29.9)   4. Sleeping difficulties- due to stress   5. Adjustment disorder with mixed anxiety and depressed mood   6. Encounter for wellness examination   7. Colon cancer screening    Orders Placed This Encounter  Procedures  . MM DIGITAL SCREENING W/ IMPLANTS BILATERAL  . Tdap vaccine greater than or equal to 61yo IM    Please see orders section below for further details of actions taken during this office visit.  Gross side effects, risk and benefits, and alternatives of medications discussed with patient.  Patient is aware that all medications have potential side effects and we are unable to predict every side effect or drug-drug interaction that may occur.  Expresses verbal understanding and consents to current therapy plan and treatment regiment.  1) Anticipatory Guidance: Discussed importance of wearing a seatbelt while driving, not texting while driving; sunscreen when outside along with yearly skin surveillance; eating a well balanced and modest diet; physical activity at least 25 minutes per day or 150 min/ week of moderate to intense activity.  2) Immunizations / Screenings / Labs:  All immunizations and screenings that patient agrees to, are up-to-date per recommendations or will be updated today.  Patient understands the needs for q 45mo dental and yearly vision screens which pt will schedule independently. Obtain CBC, CMP, HgA1c, Lipid panel, TSH and vit D when fasting if not already done recently.   3) Weight:   Discussed goal of losing even 5-10% of current body weight which would improve overall feelings of well being and improve objective health data significantly.   Improve nutrient density of diet through increasing intake of fruits and vegetables and decreasing saturated/trans fats, white flour products and refined sugar products.   F-up  preventative CPE in 1 year. F/up sooner for chronic care management as discussed and/or prn.  Please see orders placed and AVS handed out to patient at the end of our visit for further patient instructions/ counseling done pertaining to today's office visit.     Subjective:    Chief Complaint  Patient presents with  . Annual Exam   CC:  cpe  HPI: Kimberly Vang is a 61 y.o. female who presents to [facility] today a yearly health maintenance exam.  Health Maintenance Summary Reviewed and updated, unless pt declines services.  Aspirin: administering 81 mg daily Colonoscopy:     polypectomy benign in 12/15 Tdap: Up to date: needs TD  Tobacco History Reviewed:   Y  Alcohol:    No concerns, 1-2 glasses white  W  ine nightly- but not every night Exercise Habits:   Not meeting AHA guidelines STD concerns:   none Drug Use:   None Birth control method:   n/a Menses regular:     W/o for sev yrs Pap-   11/16- WNL's Lumps or breast concerns:      No=--> last done 10/2011 Breast Cancer Family History:      Pat aunt- Dad'd sister- age 60's or so Bone/ DEXA scan:    ( Unnecessary due to < 65 and average risk)  [email or web address]   Wt Readings from Last 3 Encounters:  12/10/16 163 lb 9.6 oz (74.2 kg)  07/10/16 164 lb 1.6 oz (74.4 kg)  06/20/16 165 lb 1.6 oz (74.9 kg)   BP Readings from Last 3 Encounters:  12/10/16 130/84  07/10/16 116/75  06/20/16 137/87   Pulse Readings from Last 3 Encounters:  12/10/16 71  07/10/16 79  06/20/16 70     Past Medical History:  Diagnosis Date  . Adjustment disorder with mixed anxiety and depressed mood 06/30/2016  . Chronic tension headaches 06/30/2016  . Depression 07/01/2011      Past Surgical History:  Procedure Laterality Date  . BREAST ENHANCEMENT SURGERY  1989  . left wrist surgery  2003  . tonsiectomy  1965      Family History  Problem Relation Age of Onset  . Arthritis Mother   .  Coronary artery disease Father   . Transient ischemic attack Father   . Cancer Father   . Stroke Father   . Cancer Sister       History  Drug Use No  ,   History  Alcohol Use  . 6.0 oz/week  . 10 Glasses of wine per week  ,   History  Smoking Status  . Never Smoker  Smokeless Tobacco  . Never Used  ,   History  Sexual Activity  . Sexual activity: Yes  . Birth control/ protection: Post-menopausal, None    Current Outpatient Prescriptions on File Prior to Visit  Medication Sig Dispense Refill  . cyclobenzaprine (FLEXERIL) 10 MG tablet Take 1 tablet (10 mg total) by mouth 3 (three) times daily as needed for muscle spasms. 30 tablet 0  . LORazepam (ATIVAN) 0.5 MG tablet Take 0.5 mg by mouth every 8 (eight) hours as needed for anxiety.     No current facility-administered medications on file prior to visit.     Allergies: Patient has no known allergies.  Review of Systems  Constitutional: Negative for chills and fever.  HENT: Negative for congestion and sinus pain.   Eyes: Negative for blurred vision and double vision.  Respiratory: Negative for shortness of breath and wheezing.   Cardiovascular: Negative for chest pain, palpitations and orthopnea.  Gastrointestinal: Negative for constipation, diarrhea, heartburn, nausea and vomiting.  Genitourinary: Negative for frequency and hematuria.  Musculoskeletal: Negative for falls and joint pain.       Chronic jt pains  Skin: Negative for rash.  Neurological: Negative for dizziness, sensory change and focal weakness.  Endo/Heme/Allergies: Negative for polydipsia.  Psychiatric/Behavioral: Negative for depression and suicidal ideas. The patient is not nervous/anxious and does not have insomnia.      Objective:    Blood pressure 130/84, pulse 71, height 5' 4.25" (1.632 m), weight 163 lb 9.6 oz (74.2 kg), last menstrual period 10/16/2011. Body mass index is 27.86 kg/m. General Appearance:    Alert, cooperative, no  distress, appears stated age  Head:    Normocephalic, without obvious abnormality, atraumatic  Eyes:    PERRL, conjunctiva/corneas clear, EOM's intact, fundi    benign, both eyes  Ears:    Normal TM's and external ear canals, both ears  Nose:   Nares normal, septum midline, mucosa normal, no drainage    or sinus tenderness  Throat:   Lips w/o lesion, mucosa moist, and tongue normal; teeth and   gums normal  Neck:   Supple, symmetrical, trachea midline, no adenopathy;    thyroid:  no enlargement/tenderness/nodules; no carotid   bruit or JVD  Back:     Symmetric, no curvature, ROM normal, no CVA tenderness  Lungs:     Clear to auscultation bilaterally, respirations unlabored, no       Wh/ R/ R  Chest Wall:  No tenderness or gross deformity; normal excursion   Heart:    Regular rate and rhythm, S1 and S2 normal, no murmur, rub   or gallop  Breast Exam:    No tenderness, masses, or nipple abnormality b/l; no d/c  Abdomen:     Soft, non-tender, bowel sounds active all four quadrants, NO   G/R/R, no masses, no organomegaly  Genitalia:    deferred  Rectal:    deferred  Extremities:   Extremities normal, atraumatic, no cyanosis or gross edema  Pulses:   2+ and symmetric all extremities  Skin:   Warm, dry, Skin color, texture, turgor normal, no obvious rashes or lesions Psych: No HI/SI, judgement and insight good, Euthymic mood. Full Affect.  Neurologic:   CNII-XII intact, normal strength, sensation and reflexes    Throughout   DEXA:    [email or web address]   Based on the [place] FRAX tool, a 61 year old white woman with no other risk factors has a 9.3% 10-year risk for any osteoporotic fracture. White women between the ages of 36 and 72 years with equivalent or greater 10-year fracture risks based on specific risk factors include but are not limited to the following persons: 1) a 61 year old current smoker with a BMI less than 21 kg/m2, daily alcohol use, and parental fracture  history; 34) a 61 year old woman with a parental fracture history; 47) a 61 year old woman with a BMI less than 21 kg/m2 and daily alcohol use; and 78) a 61 year old current smoker with daily alcohol use.   The FRAX tool also predicts 10-year fracture risks for black, Asian, and Hispanic women in the [place]. In general, estimated fracture risks in nonwhite women are lower than those for white women of the same age. Although the USPSTF recommends using a 9.3% 10-year fracture risk threshold to screen women aged 67 to 75 years,

## 2016-12-10 NOTE — Patient Instructions (Addendum)
Please give her stool samples for home use.     If you have insomnia or difficulty sleeping, this information is for you:  - Avoid caffeinated beverages after lunch,  no alcoholic beverages,  no eating within 2-3 hours of lying down,  avoid exposure to blue light before bed,  avoid daytime naps, and  needs to maintain a regular sleep schedule- go to sleep and wake up around the same time every night.   - Resolve concerns or worries before entering bedroom:  Discussed relaxation techniques with patient and to keep a journal to write down fears_0 worries.  I suggested seeing a counselor for CBT.   - Recommend patient meditate or do deep breathing exercises to help relax.   Incorporate the use of white noise machines or listen to "sleep meditation music", or recordings of guided meditations for sleep from YouTube which are free, such as  "guided meditation for detachment from over thinking"  by Mayford Knife.     eNCOURAGED ww   Guidelines for a Low Sodium Diet   Low Sodium Diet A main source of sodium is table salt. The average American eats five or more teaspoons of salt each day. This is about 20 times as much as the body needs. In fact, your body needs only 1/4 teaspoon of salt every day. Sodium is found naturally in foods, but a lot of it is added during processing and preparation. Many foods that do not taste salty may still be high in sodium. Large amounts of sodium can be hidden in canned, processed and convenience foods. And sodium can be found in many foods that are served at [employer].  Sodium controls fluid balance in our bodies and maintains blood volume and blood pressure. Eating too much sodium may raise blood pressure and cause fluid retention, which could lead to swelling of the legs and feet or other health issues.  When limiting sodium in your diet, a common target is to eat less than 2,000 milligrams of sodium per day.   General Guidelines for Cutting Down on  Salt Eliminate salty foods from your diet and reduce the amount of salt used in cooking. Sea salt is no better than regular salt.  Choose low sodium foods. Many salt-free or reduced salt products are available. When reading food labels, low sodium is defined as 140 mg of sodium per serving.  Salt substitutes are sometimes made from potassium, so read the label. If you are on a low potassium diet, then check with your doctor before using those salt substitutes.  Be creative and season your foods with spices, herbs, lemon, garlic, ginger, vinegar and pepper. Remove the salt shaker from the table.  Read ingredient labels to identify foods high in sodium. Items with 400 mg or more of sodium are high in sodium. High sodium food additives include salt, brine, or other items that say sodium, such as monosodium glutamate.  Eat more home-cooked meals. Foods cooked from scratch are naturally lower in sodium than most instant and boxed mixes.  Don't use softened water for cooking and drinking since it contains added salt.  Avoid medications which contain sodium such as [employer].  For more information; food composition books are available which tell how much sodium is in food. Online sources such as www.calorieking.com also list amounts.     Meats, Poultry, Fish, Legumes, Eggs and Nuts  High-Sodium Foods: Smoked, cured, salted or canned meat, fish or poultry including bacon, cold cuts,  ham, frankfurters, sausage, sardines, caviar and anchovies Frozen breaded meats and dinners, such as burritos and pizza Canned entrees, such as ravioli, spam and chili Salted nuts Beans canned with salt added  Low-Sodium Alternatives: Any fresh or frozen beef, lamb, pork, poultry and fish Eggs and egg substitutes Low-sodium peanut butter Dry peas and beans (not canned) Low-sodium canned fish Drained, water or oil packed canned fish or poultry   Dairy Products  High-Sodium  Foods: Buttermilk Regular and processed cheese, cheese spreads and sauces Cottage cheese  Low-Sodium Alternatives: Milk, yogurt, ice cream and ice milk Low-sodium cheeses, cream cheese, ricotta cheese and mozzarella   Breads, Grains and Cereals  High-Sodium Foods: Bread and rolls with salted tops Quick breads, self-rising flour, biscuit, pancake and waffle mixes Pizza, croutons and salted crackers Prepackaged, processed mixes for potatoes, rice, pasta and stuffing  Low-Sodium Alternatives: Breads, bagels and rolls without salted tops Muffins and most ready-to-eat cereals All rice and pasta, but do not to add salt when cooking Low-sodium corn and flour tortillas and noodles Low-sodium crackers and breadsticks Unsalted popcorn, chips and pretzels      Vegetables and Fruits  High-Sodium Foods: Regular canned vegetables and vegetable juices Olives, pickles, sauerkraut and other pickled vegetables Vegetables made with ham, bacon or salted pork Packaged mixes, such as scalloped or au gratin potatoes, frozen hash browns and Tater Tots Commercially prepared pasta and tomato sauces and salsa  Low-Sodium Alternatives: Fresh and frozen vegetables without sauces Low-sodium canned vegetables, sauces and juices Fresh potatoes, frozen [place] fries and instant mashed potatoes Low-salt tomato or V-8 juice. Most fresh, frozen and canned fruit Dried fruits   Soups  High-Sodium Foods: Regular canned and dehydrated soup, broth and bouillon Cup of noodles and seasoned ramen mixes  Low-Sodium Alternatives: Low-sodium canned and dehydrated soups, broth and bouillon Homemade soups without added salt   Fats, Desserts and Sweets  High-Sodium Foods: Soy sauce, seasoning salt, other sauces and marinades Bottled salad dressings, regular salad dressing with bacon bits Salted butter or margarine Instant pudding and cake Large portions of ketchup, mustard  Low-Sodium  Alternatives: Vinegar, unsalted butter or margarine Vegetable oils and low sodium sauces and salad dressings Mayonnaise All desserts made without salt    Preventive Care for Adults, Female  A healthy lifestyle and preventive care can promote health and wellness. Preventive health guidelines for women include the following key practices.   A routine yearly physical is a good way to check with your health care provider about your health and preventive screening. It is a chance to share any concerns and updates on your health and to receive a thorough exam.   Visit your dentist for a routine exam and preventive care every 6 months. Brush your teeth twice a day and floss once a day. Good oral hygiene prevents tooth decay and gum disease.   The frequency of eye exams is based on your age, health, family medical history, use of contact lenses, and other factors. Follow your health care provider's recommendations for frequency of eye exams.   Eat a healthy diet. Foods like vegetables, fruits, whole grains, low-fat dairy products, and lean protein foods contain the nutrients you need without too many calories. Decrease your intake of foods high in solid fats, added sugars, and salt. Eat the right amount of calories for you.Get information about a proper diet from your health care provider, if necessary.   Regular physical exercise is one of the most important things you can do for your health.  Most adults should get at least 150 minutes of moderate-intensity exercise (any activity that increases your heart rate and causes you to sweat) each week. In addition, most adults need muscle-strengthening exercises on 2 or more days a week.   Maintain a healthy weight. The body mass index (BMI) is a screening tool to identify possible weight problems. It provides an estimate of body fat based on height and weight. Your health care provider can find your BMI, and can help you achieve or maintain a healthy  weight.For adults 20 years and older:   - A BMI below 18.5 is considered underweight.   - A BMI of 18.5 to 24.9 is normal.   - A BMI of 25 to 29.9 is considered overweight.   - A BMI of 30 and above is considered obese.   Maintain normal blood lipids and cholesterol levels by exercising and minimizing your intake of trans and saturated fats.  Eat a balanced diet with plenty of fruit and vegetables. Blood tests for lipids and cholesterol should begin at age 7 and be repeated every 5 years minimum.  If your lipid or cholesterol levels are high, you are over 40, or you are at high risk for heart disease, you may need your cholesterol levels checked more frequently.Ongoing high lipid and cholesterol levels should be treated with medicines if diet and exercise are not working.   If you smoke, find out from your health care provider how to quit. If you do not use tobacco, do not start.   Lung cancer screening is recommended for adults aged 17-80 years who are at high risk for developing lung cancer because of a history of smoking. A yearly low-dose CT scan of the lungs is recommended for people who have at least a 30-pack-year history of smoking and are a current smoker or have quit within the past 15 years. A pack year of smoking is smoking an average of 1 pack of cigarettes a day for 1 year (for example: 1 pack a day for 30 years or 2 packs a day for 15 years). Yearly screening should continue until the smoker has stopped smoking for at least 15 years. Yearly screening should be stopped for people who develop a health problem that would prevent them from having lung cancer treatment.   If you are pregnant, do not drink alcohol. If you are breastfeeding, be very cautious about drinking alcohol. If you are not pregnant and choose to drink alcohol, do not have more than 1 drink per day. One drink is considered to be 12 ounces (355 mL) of beer, 5 ounces (148 mL) of wine, or 1.5 ounces (44 mL) of  liquor.   Avoid use of street drugs. Do not share needles with anyone. Ask for help if you need support or instructions about stopping the use of drugs.   High blood pressure causes heart disease and increases the risk of stroke. Your blood pressure should be checked at least yearly.  Ongoing high blood pressure should be treated with medicines if weight loss and exercise do not work.   If you are 16-28 years old, ask your health care provider if you should take aspirin to prevent strokes.   Diabetes screening involves taking a blood sample to check your fasting blood sugar level. This should be done once every 3 years, after age 72, if you are within normal weight and without risk factors for diabetes. Testing should be considered at a younger age or be carried out  more frequently if you are overweight and have at least 1 risk factor for diabetes.   Breast cancer screening is essential preventive care for women. You should practice "breast self-awareness."  This means understanding the normal appearance and feel of your breasts and may include breast self-examination.  Any changes detected, no matter how small, should be reported to a health care provider.  Women in their 19s and 30s should have a clinical breast exam (CBE) by a health care provider as part of a regular health exam every 1 to 3 years.  After age 60, women should have a CBE every year.  Starting at age 47, women should consider having a mammogram (breast X-ray test) every year.  Women who have a family history of breast cancer should talk to their health care provider about genetic screening.  Women at a high risk of breast cancer should talk to their health care providers about having an MRI and a mammogram every year.   -Breast cancer gene (BRCA)-related cancer risk assessment is recommended for women who have family members with BRCA-related cancers. BRCA-related cancers include breast, ovarian, tubal, and peritoneal cancers.  Having family members with these cancers may be associated with an increased risk for harmful changes (mutations) in the breast cancer genes BRCA1 and BRCA2. Results of the assessment will determine the need for genetic counseling and BRCA1 and BRCA2 testing.   The Pap test is a screening test for cervical cancer. A Pap test can show cell changes on the cervix that might become cervical cancer if left untreated. A Pap test is a procedure in which cells are obtained and examined from the lower end of the uterus (cervix).   - Women should have a Pap test starting at age 44.   - Between ages 41 and 84, Pap tests should be repeated every 2 years.   - Beginning at age 47, you should have a Pap test every 3 years as long as the past 3 Pap tests have been normal.   - Some women have medical problems that increase the chance of getting cervical cancer. Talk to your health care provider about these problems. It is especially important to talk to your health care provider if a new problem develops soon after your last Pap test. In these cases, your health care provider may recommend more frequent screening and Pap tests.   - The above recommendations are the same for women who have or have not gotten the vaccine for human papillomavirus (HPV).   - If you had a hysterectomy for a problem that was not cancer or a condition that could lead to cancer, then you no longer need Pap tests. Even if you no longer need a Pap test, a regular exam is a good idea to make sure no other problems are starting.   - If you are between ages 40 and 13 years, and you have had normal Pap tests going back 10 years, you no longer need Pap tests. Even if you no longer need a Pap test, a regular exam is a good idea to make sure no other problems are starting.   - If you have had past treatment for cervical cancer or a condition that could lead to cancer, you need Pap tests and screening for cancer for at least 20 years after your  treatment.   - If Pap tests have been discontinued, risk factors (such as a new sexual partner) need to be reassessed to determine if screening should  be resumed.   - The HPV test is an additional test that may be used for cervical cancer screening. The HPV test looks for the virus that can cause the cell changes on the cervix. The cells collected during the Pap test can be tested for HPV. The HPV test could be used to screen women aged 36 years and older, and should be used in women of any age who have unclear Pap test results. After the age of 11, women should have HPV testing at the same frequency as a Pap test.   Colorectal cancer can be detected and often prevented. Most routine colorectal cancer screening begins at the age of 95 years and continues through age 75 years. However, your health care provider may recommend screening at an earlier age if you have risk factors for colon cancer. On a yearly basis, your health care provider may provide home test kits to check for hidden blood in the stool.  Use of a small camera at the end of a tube, to directly examine the colon (sigmoidoscopy or colonoscopy), can detect the earliest forms of colorectal cancer. Talk to your health care provider about this at age 14, when routine screening begins. Direct exam of the colon should be repeated every 5 -10 years through age 2 years, unless early forms of pre-cancerous polyps or small growths are found.   People who are at an increased risk for hepatitis B should be screened for this virus. You are considered at high risk for hepatitis B if:  -You were born in a country where hepatitis B occurs often. Talk with your health care provider about which countries are considered high risk.  - Your parents were born in a high-risk country and you have not received a shot to protect against hepatitis B (hepatitis B vaccine).  - You have HIV or AIDS.  - You use needles to inject street drugs.  - You live with, or  have sex with, someone who has Hepatitis B.  - You get hemodialysis treatment.  - You take certain medicines for conditions like cancer, organ transplantation, and autoimmune conditions.   Hepatitis C blood testing is recommended for all people born from [birth year] through 1965 and any individual with known risks for hepatitis C.   Practice safe sex. Use condoms and avoid high-risk sexual practices to reduce the spread of sexually transmitted infections (STIs). STIs include gonorrhea, chlamydia, syphilis, trichomonas, herpes, HPV, and human immunodeficiency virus (HIV). Herpes, HIV, and HPV are viral illnesses that have no cure. They can result in disability, cancer, and death. Sexually active women aged 4 years and younger should be checked for chlamydia. Older women with new or multiple partners should also be tested for chlamydia. Testing for other STIs is recommended if you are sexually active and at increased risk.   Osteoporosis is a disease in which the bones lose minerals and strength with aging. This can result in serious bone fractures or breaks. The risk of osteoporosis can be identified using a bone density scan. Women ages 37 years and over and women at risk for fractures or osteoporosis should discuss screening with their health care providers. Ask your health care provider whether you should take a calcium supplement or vitamin D to There are also several preventive steps women can take to avoid osteoporosis and resulting fractures or to keep osteoporosis from worsening. -->Recommendations include:  Eat a balanced diet high in fruits, vegetables, calcium, and vitamins.  Get enough calcium. The recommended total  intake of is 1,200 mg daily; for best absorption, if taking supplements, divide doses into 250-500 mg doses throughout the day. Of the two types of calcium, calcium carbonate is best absorbed when taken with food but calcium citrate can be taken on an empty stomach.  Get enough  vitamin D. [employer] and the [employer] recommend at least 1,000 IU per day for women age 79 and over who are at risk of vitamin D deficiency. Vitamin D deficiency can be caused by inadequate sun exposure (for example, those who live in [place]).  Avoid alcohol and smoking. Heavy alcohol intake (more than 7 drinks per week) increases the risk of falls and hip fracture and women smokers tend to lose bone more rapidly and have lower bone mass than nonsmokers. Stopping smoking is one of the most important changes women can make to improve their health and decrease risk for disease.  Be physically active every day. Weight-bearing exercise (for example, fast walking, hiking, jogging, and weight training) may strengthen bones or slow the rate of bone loss that comes with aging. Balancing and muscle-strengthening exercises can reduce the risk of falling and fracture.  Consider therapeutic medications. Currently, several types of effective drugs are available. Healthcare providers can recommend the type most appropriate for each woman.  Eliminate environmental factors that may contribute to accidents. Falls cause nearly 90% of all osteoporotic fractures, so reducing this risk is an important bone-health strategy. Measures include ample lighting, removing obstructions to walking, using nonskid rugs on floors, and placing mats and/or grab bars in showers.  Be aware of medication side effects. Some common medicines make bones weaker. These include a type of steroid drug called glucocorticoids used for arthritis and asthma, some antiseizure drugs, certain sleeping pills, treatments for endometriosis, and some cancer drugs. An overactive thyroid gland or using too much thyroid hormone for an underactive thyroid can also be a problem. If you are taking these medicines, talk to your doctor about what you can do to help protect your bones.reduce the rate of osteoporosis.    Menopause can be  associated with physical symptoms and risks. Hormone replacement therapy is available to decrease symptoms and risks. You should talk to your health care provider about whether hormone replacement therapy is right for you.   Use sunscreen. Apply sunscreen liberally and repeatedly throughout the day. You should seek shade when your shadow is shorter than you. Protect yourself by wearing long sleeves, pants, a wide-brimmed hat, and sunglasses year round, whenever you are outdoors.   Once a month, do a whole body skin exam, using a mirror to look at the skin on your back. Tell your health care provider of new moles, moles that have irregular borders, moles that are larger than a pencil eraser, or moles that have changed in shape or color.   -Stay current with required vaccines (immunizations).   Influenza vaccine. All adults should be immunized every year.  Tetanus, diphtheria, and acellular pertussis (Td, Tdap) vaccine. Pregnant women should receive 1 dose of Tdap vaccine during each pregnancy. The dose should be obtained regardless of the length of time since the last dose. Immunization is preferred during the 27th 36th week of gestation. An adult who has not previously received Tdap or who does not know her vaccine status should receive 1 dose of Tdap. This initial dose should be followed by tetanus and diphtheria toxoids (Td) booster doses every 10 years. Adults with an unknown or incomplete history of completing a 3-dose  immunization series with Td-containing vaccines should begin or complete a primary immunization series including a Tdap dose. Adults should receive a Td booster every 10 years.  Varicella vaccine. An adult without evidence of immunity to varicella should receive 2 doses or a second dose if she has previously received 1 dose. Pregnant females who do not have evidence of immunity should receive the first dose after pregnancy. This first dose should be obtained before leaving the  health care facility. The second dose should be obtained 4 8 weeks after the first dose.  Human papillomavirus (HPV) vaccine. Females aged 60 26 years who have not received the vaccine previously should obtain the 3-dose series. The vaccine is not recommended for use in pregnant females. However, pregnancy testing is not needed before receiving a dose. If a female is found to be pregnant after receiving a dose, no treatment is needed. In that case, the remaining doses should be delayed until after the pregnancy. Immunization is recommended for any person with an immunocompromised condition through the age of 5 years if she did not get any or all doses earlier. During the 3-dose series, the second dose should be obtained 4 8 weeks after the first dose. The third dose should be obtained 24 weeks after the first dose and 16 weeks after the second dose.  Zoster vaccine. One dose is recommended for adults aged 16 years or older unless certain conditions are present.  Measles, mumps, and rubella (MMR) vaccine. Adults born before 4 generally are considered immune to measles and mumps. Adults born in [birth year] or later should have 1 or more doses of MMR vaccine unless there is a contraindication to the vaccine or there is laboratory evidence of immunity to each of the three diseases. A routine second dose of MMR vaccine should be obtained at least 28 days after the first dose for students attending postsecondary schools, health care workers, or international travelers. People who received inactivated measles vaccine or an unknown type of measles vaccine during 1963 1967 should receive 2 doses of MMR vaccine. People who received inactivated mumps vaccine or an unknown type of mumps vaccine before 1979 and are at high risk for mumps infection should consider immunization with 2 doses of MMR vaccine. For females of childbearing age, rubella immunity should be determined. If there is no evidence of immunity, females who  are not pregnant should be vaccinated. If there is no evidence of immunity, females who are pregnant should delay immunization until after pregnancy. Unvaccinated health care workers born before 40 who lack laboratory evidence of measles, mumps, or rubella immunity or laboratory confirmation of disease should consider measles and mumps immunization with 2 doses of MMR vaccine or rubella immunization with 1 dose of MMR vaccine.  Pneumococcal 13-valent conjugate (PCV13) vaccine. When indicated, a person who is uncertain of her immunization history and has no record of immunization should receive the PCV13 vaccine. An adult aged 25 years or older who has certain medical conditions and has not been previously immunized should receive 1 dose of PCV13 vaccine. This PCV13 should be followed with a dose of pneumococcal polysaccharide (PPSV23) vaccine. The PPSV23 vaccine dose should be obtained at least 8 weeks after the dose of PCV13 vaccine. An adult aged 62 years or older who has certain medical conditions and previously received 1 or more doses of PPSV23 vaccine should receive 1 dose of PCV13. The PCV13 vaccine dose should be obtained 1 or more years after the last PPSV23 vaccine dose.  Pneumococcal polysaccharide (PPSV23) vaccine. When PCV13 is also indicated, PCV13 should be obtained first. All adults aged 1 years and older should be immunized. An adult younger than age 88 years who has certain medical conditions should be immunized. Any person who resides in a nursing home or long-term care facility should be immunized. An adult smoker should be immunized. People with an immunocompromised condition and certain other conditions should receive both PCV13 and PPSV23 vaccines. People with human immunodeficiency virus (HIV) infection should be immunized as soon as possible after diagnosis. Immunization during chemotherapy or radiation therapy should be avoided. Routine use of PPSV23 vaccine is not recommended for  American Indians, [place] Natives, or people younger than 65 years unless there are medical conditions that require PPSV23 vaccine. When indicated, people who have unknown immunization and have no record of immunization should receive PPSV23 vaccine. One-time revaccination 5 years after the first dose of PPSV23 is recommended for people aged 50 64 years who have chronic kidney failure, nephrotic syndrome, asplenia, or immunocompromised conditions. People who received 1 2 doses of PPSV23 before age 4 years should receive another dose of PPSV23 vaccine at age 4 years or later if at least 5 years have passed since the previous dose. Doses of PPSV23 are not needed for people immunized with PPSV23 at or after age 37 years.  Meningococcal vaccine. Adults with asplenia or persistent complement component deficiencies should receive 2 doses of quadrivalent meningococcal conjugate (MenACWY-D) vaccine. The doses should be obtained at least 2 months apart. Microbiologists working with certain meningococcal bacteria, [employer] recruits, people at risk during an outbreak, and people who travel to or live in countries with a high rate of meningitis should be immunized. A first-year college student up through age 64 years who is living in a residence hall should receive a dose if she did not receive a dose on or after her 16th birthday. Adults who have certain high-risk conditions should receive one or more doses of vaccine.  Hepatitis A vaccine. Adults who wish to be protected from this disease, have certain high-risk conditions, work with hepatitis A-infected animals, work in hepatitis A research labs, or travel to or work in countries with a high rate of hepatitis A should be immunized. Adults who were previously unvaccinated and who anticipate close contact with an international adoptee during the first 60 days after arrival in the [place] from a country with a high rate of hepatitis A should be  immunized.  Hepatitis B vaccine.  Adults who wish to be protected from this disease, have certain high-risk conditions, may be exposed to blood or other infectious body fluids, are household contacts or sex partners of hepatitis B positive people, are clients or workers in certain care facilities, or travel to or work in countries with a high rate of hepatitis B should be immunized.  Haemophilus influenzae type b (Hib) vaccine. A previously unvaccinated person with asplenia or sickle cell disease or having a scheduled splenectomy should receive 1 dose of Hib vaccine. Regardless of previous immunization, a recipient of a hematopoietic stem cell transplant should receive a 3-dose series 6 12 months after her successful transplant. Hib vaccine is not recommended for adults with HIV infection.  Preventive Services / Frequency Ages 20 to 39years  Blood pressure check.** / Every 1 to 2 years.  Lipid and cholesterol check.** / Every 5 years beginning at age 68.  Clinical breast exam.** / Every 3 years for women in their 72s and 63s.  BRCA-related cancer  risk assessment.** / For women who have family members with a BRCA-related cancer (breast, ovarian, tubal, or peritoneal cancers).  Pap test.** / Every 2 years from ages 71 through 104. Every 3 years starting at age 92 through age 9 or 31 with a history of 3 consecutive normal Pap tests.  HPV screening.** / Every 3 years from ages 54 through ages 36 to 60 with a history of 3 consecutive normal Pap tests.  Hepatitis C blood test.** / For any individual with known risks for hepatitis C.  Skin self-exam. / Monthly.  Influenza vaccine. / Every year.  Tetanus, diphtheria, and acellular pertussis (Tdap, Td) vaccine.** / Consult your health care provider. Pregnant women should receive 1 dose of Tdap vaccine during each pregnancy. 1 dose of Td every 10 years.  Varicella vaccine.** / Consult your health care provider. Pregnant females who do not have  evidence of immunity should receive the first dose after pregnancy.  HPV vaccine. / 3 doses over 6 months, if 42 and younger. The vaccine is not recommended for use in pregnant females. However, pregnancy testing is not needed before receiving a dose.  Measles, mumps, rubella (MMR) vaccine.** / You need at least 1 dose of MMR if you were born in 1957 or later. You may also need a 2nd dose. For females of childbearing age, rubella immunity should be determined. If there is no evidence of immunity, females who are not pregnant should be vaccinated. If there is no evidence of immunity, females who are pregnant should delay immunization until after pregnancy.  Pneumococcal 13-valent conjugate (PCV13) vaccine.** / Consult your health care provider.  Pneumococcal polysaccharide (PPSV23) vaccine.** / 1 to 2 doses if you smoke cigarettes or if you have certain conditions.  Meningococcal vaccine.** / 1 dose if you are age 36 to 33 years and a Market researcher living in a residence hall, or have one of several medical conditions, you need to get vaccinated against meningococcal disease. You may also need additional booster doses.  Hepatitis A vaccine.** / Consult your health care provider.  Hepatitis B vaccine.** / Consult your health care provider.  Haemophilus influenzae type b (Hib) vaccine.** / Consult your health care provider.  Ages 17 to 64years  Blood pressure check.** / Every 1 to 2 years.  Lipid and cholesterol check.** / Every 5 years beginning at age 61 years.  Lung cancer screening. / Every year if you are aged 85 80 years and have a 30-pack-year history of smoking and currently smoke or have quit within the past 15 years. Yearly screening is stopped once you have quit smoking for at least 15 years or develop a health problem that would prevent you from having lung cancer treatment.  Clinical breast exam.** / Every year after age 36 years.  BRCA-related cancer risk  assessment.** / For women who have family members with a BRCA-related cancer (breast, ovarian, tubal, or peritoneal cancers).  Mammogram.** / Every year beginning at age 60 years and continuing for as long as you are in good health. Consult with your health care provider.  Pap test.** / Every 3 years starting at age 47 years through age 42 or 45 years with a history of 3 consecutive normal Pap tests.  HPV screening.** / Every 3 years from ages 32 years through ages 51 to 87 years with a history of 3 consecutive normal Pap tests.  Fecal occult blood test (FOBT) of stool. / Every year beginning at age 70 years and continuing until  age 82 years. You may not need to do this test if you get a colonoscopy every 10 years.  Flexible sigmoidoscopy or colonoscopy.** / Every 5 years for a flexible sigmoidoscopy or every 10 years for a colonoscopy beginning at age 74 years and continuing until age 13 years.  Hepatitis C blood test.** / For all people born from [birth year] through 1965 and any individual with known risks for hepatitis C.  Skin self-exam. / Monthly.  Influenza vaccine. / Every year.  Tetanus, diphtheria, and acellular pertussis (Tdap/Td) vaccine.** / Consult your health care provider. Pregnant women should receive 1 dose of Tdap vaccine during each pregnancy. 1 dose of Td every 10 years.  Varicella vaccine.** / Consult your health care provider. Pregnant females who do not have evidence of immunity should receive the first dose after pregnancy.  Zoster vaccine.** / 1 dose for adults aged 80 years or older.  Measles, mumps, rubella (MMR) vaccine.** / You need at least 1 dose of MMR if you were born in 1957 or later. You may also need a 2nd dose. For females of childbearing age, rubella immunity should be determined. If there is no evidence of immunity, females who are not pregnant should be vaccinated. If there is no evidence of immunity, females who are pregnant should delay immunization until  after pregnancy.  Pneumococcal 13-valent conjugate (PCV13) vaccine.** / Consult your health care provider.  Pneumococcal polysaccharide (PPSV23) vaccine.** / 1 to 2 doses if you smoke cigarettes or if you have certain conditions.  Meningococcal vaccine.** / Consult your health care provider.  Hepatitis A vaccine.** / Consult your health care provider.  Hepatitis B vaccine.** / Consult your health care provider.  Haemophilus influenzae type b (Hib) vaccine.** / Consult your health care provider.  Ages 16 years and over  Blood pressure check.** / Every 1 to 2 years.  Lipid and cholesterol check.** / Every 5 years beginning at age 18 years.  Lung cancer screening. / Every year if you are aged 52 80 years and have a 30-pack-year history of smoking and currently smoke or have quit within the past 15 years. Yearly screening is stopped once you have quit smoking for at least 15 years or develop a health problem that would prevent you from having lung cancer treatment.  Clinical breast exam.** / Every year after age 35 years.  BRCA-related cancer risk assessment.** / For women who have family members with a BRCA-related cancer (breast, ovarian, tubal, or peritoneal cancers).  Mammogram.** / Every year beginning at age 30 years and continuing for as long as you are in good health. Consult with your health care provider.  Pap test.** / Every 3 years starting at age 75 years through age 66 or 74 years with 3 consecutive normal Pap tests. Testing can be stopped between 65 and 70 years with 3 consecutive normal Pap tests and no abnormal Pap or HPV tests in the past 10 years.  HPV screening.** / Every 3 years from ages 64 years through ages 73 or 36 years with a history of 3 consecutive normal Pap tests. Testing can be stopped between 65 and 70 years with 3 consecutive normal Pap tests and no abnormal Pap or HPV tests in the past 10 years.  Fecal occult blood test (FOBT) of stool. / Every year  beginning at age 65 years and continuing until age 4 years. You may not need to do this test if you get a colonoscopy every 10 years.  Flexible sigmoidoscopy or colonoscopy.** /  Every 5 years for a flexible sigmoidoscopy or every 10 years for a colonoscopy beginning at age 2 years and continuing until age 52 years.  Hepatitis C blood test.** / For all people born from [birth year] through 1965 and any individual with known risks for hepatitis C.  Osteoporosis screening.** / A one-time screening for women ages 30 years and over and women at risk for fractures or osteoporosis.  Skin self-exam. / Monthly.  Influenza vaccine. / Every year.  Tetanus, diphtheria, and acellular pertussis (Tdap/Td) vaccine.** / 1 dose of Td every 10 years.  Varicella vaccine.** / Consult your health care provider.  Zoster vaccine.** / 1 dose for adults aged 16 years or older.  Pneumococcal 13-valent conjugate (PCV13) vaccine.** / Consult your health care provider.  Pneumococcal polysaccharide (PPSV23) vaccine.** / 1 dose for all adults aged 70 years and older.  Meningococcal vaccine.** / Consult your health care provider.  Hepatitis A vaccine.** / Consult your health care provider.  Hepatitis B vaccine.** / Consult your health care provider.  Haemophilus influenzae type b (Hib) vaccine.** / Consult your health care provider. ** Family history and personal history of risk and conditions may change your health care provider's recommendations. Document Released: 01/21/2002 Document Revised: 09/15/2013  [facility] Patient Information 2014 [place], [place].   EXERCISE AND DIET:  We recommended that you start or continue a regular exercise program for good health. Regular exercise means any activity that makes your heart beat faster and makes you sweat.  We recommend exercising at least 30 minutes per day at least 3 days a week, preferably 5.  We also recommend a diet low in fat and sugar / carbohydrates.  Inactivity,  poor dietary choices and obesity can cause diabetes, heart attack, stroke, and kidney damage, among others.     ALCOHOL AND SMOKING:  Women should limit their alcohol intake to no more than 7 drinks/beers/glasses of wine (combined, not each!) per week. Moderation of alcohol intake to this level decreases your risk of breast cancer and liver damage.  ( And of course, no recreational drugs are part of a healthy lifestyle.)  Also, you should not be smoking at all or even being exposed to second hand smoke. Most people know smoking can cause cancer, and various heart and lung diseases, but did you know it also contributes to weakening of your bones?  Aging of your skin?  Yellowing of your teeth and nails?   CALCIUM AND VITAMIN D:  Adequate intake of calcium and Vitamin D are recommended.  The recommendations for exact amounts of these supplements seem to change often, but generally speaking 600 mg of calcium (either carbonate or citrate) and 800 units of Vitamin D per day seems prudent. Certain women may benefit from higher intake of Vitamin D.  If you are among these women, your doctor will have told you during your visit.     PAP SMEARS:  Pap smears, to check for cervical cancer or precancers,  have traditionally been done yearly, although recent scientific advances have shown that most women can have pap smears less often.  However, every woman still should have a physical exam from her gynecologist or primary care physician every year. It will include a breast check, inspection of the vulva and vagina to check for abnormal growths or skin changes, a visual exam of the cervix, and then an exam to evaluate the size and shape of the uterus and ovaries.  And after 61 years of age, a rectal exam is indicated  to check for rectal cancers. We will also provide age appropriate advice regarding health maintenance, like when you should have certain vaccines, screening for sexually transmitted diseases, bone density  testing, colonoscopy, mammograms, etc.    MAMMOGRAMS:  All women over 28 years old should have a yearly mammogram. Many facilities now offer a "3D" mammogram, which may cost around $50 extra out of pocket. If possible,  we recommend you accept the option to have the 3D mammogram performed.  It both reduces the number of women who will be called back for extra views which then turn out to be normal, and it is better than the routine mammogram at detecting truly abnormal areas.     COLONOSCOPY:  Colonoscopy to screen for colon cancer is recommended for all women at age 52.  We know, you hate the idea of the prep.  We agree, BUT, having colon cancer and not knowing it is worse!!  Colon cancer so often starts as a polyp that can be seen and removed at colonscopy, which can quite literally save your life!  And if your first colonoscopy is normal and you have no family history of colon cancer, most women don't have to have it again for 10 years.  Once every ten years, you can do something that may end up saving your life, right?  We will be happy to help you get it scheduled when you are ready.  Be sure to check your insurance coverage so you understand how much it will cost.  It may be covered as a preventative service at no cost, but you should check your particular policy.

## 2016-12-11 ENCOUNTER — Other Ambulatory Visit: Payer: Self-pay

## 2016-12-11 DIAGNOSIS — Z0000 Encounter for general adult medical examination without abnormal findings: Secondary | ICD-10-CM

## 2016-12-11 DIAGNOSIS — Z1389 Encounter for screening for other disorder: Secondary | ICD-10-CM

## 2016-12-13 ENCOUNTER — Other Ambulatory Visit: Payer: 59

## 2016-12-18 ENCOUNTER — Other Ambulatory Visit (INDEPENDENT_AMBULATORY_CARE_PROVIDER_SITE_OTHER): Payer: 59

## 2016-12-18 DIAGNOSIS — Z1389 Encounter for screening for other disorder: Secondary | ICD-10-CM

## 2016-12-18 DIAGNOSIS — Z0000 Encounter for general adult medical examination without abnormal findings: Secondary | ICD-10-CM | POA: Diagnosis not present

## 2016-12-19 LAB — COMPREHENSIVE METABOLIC PANEL
ALT: 16 IU/L (ref 0–32)
AST: 21 IU/L (ref 0–40)
Albumin/Globulin Ratio: 1.8 (ref 1.2–2.2)
Albumin: 4.2 g/dL (ref 3.6–4.8)
Alkaline Phosphatase: 83 IU/L (ref 39–117)
BUN/Creatinine Ratio: 13 (ref 12–28)
BUN: 12 mg/dL (ref 8–27)
Bilirubin Total: 1.2 mg/dL (ref 0.0–1.2)
CO2: 26 mmol/L (ref 18–29)
Calcium: 9.6 mg/dL (ref 8.7–10.3)
Chloride: 106 mmol/L (ref 96–106)
Creatinine, Ser: 0.89 mg/dL (ref 0.57–1.00)
GFR calc Af Amer: 81 mL/min/{1.73_m2} (ref >59)
GFR calc non Af Amer: 71 mL/min/{1.73_m2} (ref >59)
Globulin, Total: 2.4 g/dL (ref 1.5–4.5)
Glucose: 90 mg/dL (ref 65–99)
Potassium: 5.4 mmol/L — ABNORMAL HIGH (ref 3.5–5.2)
Sodium: 145 mmol/L — ABNORMAL HIGH (ref 134–144)
Total Protein: 6.6 g/dL (ref 6.0–8.5)

## 2016-12-19 LAB — CBC WITH DIFFERENTIAL/PLATELET
Basophils Absolute: 0 10*3/uL (ref 0.0–0.2)
Basos: 1 %
EOS (ABSOLUTE): 0.2 10*3/uL (ref 0.0–0.4)
Eos: 3 %
Hematocrit: 45.1 % (ref 34.0–46.6)
Hemoglobin: 15.2 g/dL (ref 11.1–15.9)
Immature Grans (Abs): 0 10*3/uL (ref 0.0–0.1)
Immature Granulocytes: 1 %
Lymphocytes Absolute: 1.6 10*3/uL (ref 0.7–3.1)
Lymphs: 32 %
MCH: 30.6 pg (ref 26.6–33.0)
MCHC: 33.7 g/dL (ref 31.5–35.7)
MCV: 91 fL (ref 79–97)
Monocytes Absolute: 0.4 10*3/uL (ref 0.1–0.9)
Monocytes: 7 %
Neutrophils Absolute: 2.9 10*3/uL (ref 1.4–7.0)
Neutrophils: 56 %
Platelets: 248 10*3/uL (ref 150–379)
RBC: 4.96 x10E6/uL (ref 3.77–5.28)
RDW: 13.6 % (ref 12.3–15.4)
WBC: 5.2 10*3/uL (ref 3.4–10.8)

## 2016-12-19 LAB — LIPID PANEL
Chol/HDL Ratio: 3.2 ratio units (ref 0.0–4.4)
Cholesterol, Total: 220 mg/dL — ABNORMAL HIGH (ref 100–199)
HDL: 68 mg/dL (ref >39)
LDL Calculated: 131 mg/dL — ABNORMAL HIGH (ref 0–99)
Triglycerides: 106 mg/dL (ref 0–149)
VLDL Cholesterol Cal: 21 mg/dL (ref 5–40)

## 2016-12-19 LAB — VITAMIN D 25 HYDROXY (VIT D DEFICIENCY, FRACTURES): Vit D, 25-Hydroxy: 28.8 ng/mL — ABNORMAL LOW (ref 30.0–100.0)

## 2016-12-19 LAB — HEMOGLOBIN A1C
Est. average glucose Bld gHb Est-mCnc: 111 mg/dL
Hgb A1c MFr Bld: 5.5 % (ref 4.8–5.6)

## 2016-12-19 LAB — TSH: TSH: 2.09 u[IU]/mL (ref 0.450–4.500)

## 2016-12-25 ENCOUNTER — Ambulatory Visit: Payer: 59

## 2017-01-06 ENCOUNTER — Encounter: Payer: Self-pay | Admitting: Adult Health

## 2017-01-06 ENCOUNTER — Ambulatory Visit (INDEPENDENT_AMBULATORY_CARE_PROVIDER_SITE_OTHER): Payer: 59 | Admitting: Adult Health

## 2017-01-06 VITALS — BP 124/77 | HR 72 | Temp 98.2°F | Ht 64.25 in | Wt 163.9 lb

## 2017-01-06 DIAGNOSIS — J0120 Acute ethmoidal sinusitis, unspecified: Secondary | ICD-10-CM

## 2017-01-06 DIAGNOSIS — R51 Headache: Secondary | ICD-10-CM

## 2017-01-06 DIAGNOSIS — R519 Headache, unspecified: Secondary | ICD-10-CM | POA: Insufficient documentation

## 2017-01-06 MED ORDER — DOXYCYCLINE HYCLATE 100 MG PO TABS: 100.0000 mg | ORAL_TABLET | Freq: Two times a day (BID) | ORAL | 0 refills | Status: DC

## 2017-01-06 MED ORDER — FLUTICASONE PROPIONATE 50 MCG/ACT NA SUSP: 2.0000 | Freq: Every day | NASAL | 6 refills | Status: DC

## 2017-01-06 MED FILL — FLUTICASONE PROP 50 MCG SPR: 50 | 30 days supply | Qty: 16 | Fill #0

## 2017-01-06 MED FILL — DOXYCYCLINE HYCLATE 100 MG: 100 | 10 days supply | Qty: 20 | Fill #0

## 2017-01-06 NOTE — Progress Notes (Signed)
Subjective:    Patient ID: Kimberly Vang, female    DOB: December 23, 1955, 61 y.o.   MRN: [id number]  HPI:  Kimberly Vang presents with HA and green/thick nasal drainage that both began approx. 5 weeks ago.  She reports HA occurs most days and fluctuates between 4/10-10/10, currently 4/10.  Pain is localized over forehead and around eyes and nose.  She denies vision changes, N.V and dizziness.  She denies fever,night sweats and has had intermittent non-productive cough.  She has been using OTC Sudafed and Afrin with only minimal sx relief.     Patient Care Team    Relationship Specialty Notifications Start End  Mellody Dance, DO PCP - General Family Medicine  11/08/16     Patient Active Problem List   Diagnosis Date Noted  . Acute non-recurrent ethmoidal sinusitis 01/06/2017  . Acute nonintractable headache 01/06/2017  . Overweight (BMI 25.0-29.9) 07/13/2016  . Sleeping difficulties- due to stress 07/13/2016  . Myalgia and myositis 07/10/2016  . Chronic tension headaches 06/30/2016  . Adjustment disorder with mixed anxiety and depressed mood 06/30/2016  . Chest pain 01/12/2016  . Abnormal stress test 01/12/2016     Past Medical History:  Diagnosis Date  . Adjustment disorder with mixed anxiety and depressed mood 06/30/2016  . Chronic tension headaches 06/30/2016  . Depression 07/01/2011     Past Surgical History:  Procedure Laterality Date  . BREAST ENHANCEMENT SURGERY  1989  . left wrist surgery  2003  . tonsiectomy  1965     Family History  Problem Relation Age of Onset  . Arthritis Mother   . Coronary artery disease Father   . Transient ischemic attack Father   . Cancer Father   . Stroke Father   . Cancer Sister      History  Drug Use No     History  Alcohol Use  . 6.0 oz/week  . 10 Glasses of wine per week     History  Smoking Status  . Never Smoker  Smokeless Tobacco  . Never Used     Outpatient Encounter Prescriptions as of 01/06/2017  Medication Sig  Note  . cyclobenzaprine (FLEXERIL) 10 MG tablet Take 1 tablet (10 mg total) by mouth 3 (three) times daily as needed for muscle spasms.   Marland Kitchen escitalopram (LEXAPRO) 20 MG tablet One tab daily   . LORazepam (ATIVAN) 0.5 MG tablet Take 0.5 mg by mouth every 8 (eight) hours as needed for anxiety.   . LOTEMAX 0.5 % GEL Place 1 application into both eyes 2 (two) times daily. 12/10/2016: Received from: External Pharmacy  . doxycycline (VIBRA-TABS) 100 MG tablet Take 1 tablet (100 mg total) by mouth 2 (two) times daily.   . fluticasone (FLONASE) 50 MCG/ACT nasal spray Place 2 sprays into both nostrils daily.    No facility-administered encounter medications on file as of 01/06/2017.     Allergies: Patient has no known allergies.  Body mass index is 27.91 kg/m.  Blood pressure 124/77, pulse 72, temperature 98.2 F (36.8 C), temperature source Oral, height 5' 4.25" (1.632 m), weight 163 lb 14.4 oz (74.3 kg), last menstrual period 10/16/2011.     FOLLOW-UP:  Return if symptoms worsen or fail to improve.   Review of Systems  Constitutional: Negative for activity change, appetite change, chills, diaphoresis, fatigue, fever and unexpected weight change.  HENT: Positive for congestion, postnasal drip, rhinorrhea, sinus pain, sinus pressure and sneezing. Negative for sore throat, tinnitus, trouble swallowing and voice change.  Eyes: Negative for visual disturbance.  Respiratory: Positive for cough. Negative for shortness of breath, wheezing and stridor.   Cardiovascular: Negative for chest pain, palpitations and leg swelling.  Gastrointestinal: Positive for diarrhea. Negative for abdominal distention, nausea and vomiting.       Reports wed/thurs last week with diarrhea.  Denies blood in stool. Denies current abdominal pain and reports formed stool the last 2 days.  Genitourinary: Negative for difficulty urinating.  Musculoskeletal: Negative for arthralgias.  Skin: Negative for color change, pallor,  rash and wound.  Neurological: Positive for headaches. Negative for dizziness, speech difficulty and weakness.  :       Objective:   Physical Exam  Constitutional: She is oriented to person, place, and time. She appears well-developed and well-nourished. No distress.  HENT:  Head: Normocephalic and atraumatic.  Right Ear: Hearing normal. Tympanic membrane is erythematous and bulging.  Left Ear: Hearing normal. Tympanic membrane is erythematous and bulging.  Nose: Mucosal edema and rhinorrhea present. Right sinus exhibits maxillary sinus tenderness and frontal sinus tenderness. Left sinus exhibits maxillary sinus tenderness and frontal sinus tenderness.  Mouth/Throat: Uvula is midline. Mucous membranes are not cyanotic. Posterior oropharyngeal erythema present. No tonsillar abscesses.  Cardiovascular: Normal rate, regular rhythm, normal heart sounds and intact distal pulses.   Pulmonary/Chest: Effort normal and breath sounds normal. No respiratory distress. She has no wheezes. She has no rales.  Abdominal: Soft. Bowel sounds are normal.  Neurological: She is alert and oriented to person, place, and time. She has normal reflexes.  Skin: Skin is warm and dry. She is not diaphoretic.  Psychiatric: She has a normal mood and affect. Her behavior is normal. Judgment and thought content normal.  Nursing note and vitals reviewed.         Assessment & Plan:   1. Acute non-recurrent ethmoidal sinusitis   2. Acute nonintractable headache, unspecified headache type     Acute non-recurrent ethmoidal sinusitis Take full course of ABX.  Increase fluids/rest/vit c.    Acute nonintractable headache Increase fluids. Continue OTC Ibuprofen as needed for pain control.     FOLLOW-UP:  Return if symptoms worsen or fail to improve.

## 2017-01-06 NOTE — Assessment & Plan Note (Signed)
Increase fluids. Continue OTC Ibuprofen as needed for pain control.

## 2017-01-06 NOTE — Assessment & Plan Note (Signed)
Take full course of ABX.  Increase fluids/rest/vit c.

## 2017-01-06 NOTE — Patient Instructions (Addendum)
Sinus Headache A sinus headache happens when your sinuses become clogged or swollen. You may feel pain or pressure in your face, forehead, ears, or upper teeth. Sinus headaches can be mild or severe. Follow these instructions at home:  Take medicines only as told by your doctor.  If you were given an antibiotic medicine, finish all of it even if you start to feel better.  Use a nose spray if you feel stuffed up (congested).  If told, apply a warm, moist washcloth to your face to help lessen pain. Contact a doctor if:  You get headaches more than one time each week.  Light or sound bothers you.  You have a fever.  You feel sick to your stomach (nauseous) or you throw up (vomit).  Your headaches do not get better with treatment. Get help right away if:  You have trouble seeing.  You suddenly have very bad pain in your face or head.  You start to twitch or shake (seizure).  You are confused.  You have a stiff neck. This information is not intended to replace advice given to you by your health care provider. Make sure you discuss any questions you have with your health care provider. Document Released: 03/27/2011 Document Revised: 07/21/2016 Document Reviewed: 11/21/2014 Elsevier Interactive Patient Education  2017 [place] headache patient instructions here. Sinusitis, Adult Sinusitis is soreness and inflammation of your sinuses. Sinuses are hollow spaces in the bones around your face. They are located:  Around your eyes.  In the middle of your forehead.  Behind your nose.  In your cheekbones. Your sinuses and nasal passages are lined with a stringy fluid (mucus). Mucus normally drains out of your sinuses. When your nasal tissues get inflamed or swollen, the mucus can get trapped or blocked so air cannot flow through your sinuses. This lets bacteria, viruses, and funguses grow, and that leads to infection. Follow these instructions at home: Medicines  Take,  use, or apply over-the-counter and prescription medicines only as told by your doctor. These may include nasal sprays.  If you were prescribed an antibiotic medicine, take it as told by your doctor. Do not stop taking the antibiotic even if you start to feel better. Hydrate and Humidify  Drink enough water to keep your pee (urine) clear or pale yellow.  Use a cool mist humidifier to keep the humidity level in your home above 50%.  Breathe in steam for 10-15 minutes, 3-4 times a day or as told by your doctor. You can do this in the bathroom while a hot shower is running.  Try not to spend time in cool or dry air. Rest  Rest as much as possible.  Sleep with your head raised (elevated).  Make sure to get enough sleep each night. General instructions  Put a warm, moist washcloth on your face 3-4 times a day or as told by your doctor. This will help with discomfort.  Wash your hands often with soap and water. If there is no soap and water, use hand sanitizer.  Do not smoke. Avoid being around people who are smoking (secondhand smoke).  Keep all follow-up visits as told by your doctor. This is important. Contact a doctor if:  You have a fever.  Your symptoms get worse.  Your symptoms do not get better within 10 days. Get help right away if:  You have a very bad headache.  You cannot stop throwing up (vomiting).  You have pain or swelling around your face  or eyes.  You have trouble seeing.  You feel confused.  Your neck is stiff.  You have trouble breathing. This information is not intended to replace advice given to you by your health care provider. Make sure you discuss any questions you have with your health care provider. Document Released: 05/13/2008 Document Revised: 07/21/2016 Document Reviewed: 09/20/2015 Elsevier Interactive Patient Education  2017 [employer].   Please take medications as directed.  Increase fluids/rest/vit c.   If symptoms persist after  antibiotic completed, then please return for re-evaluation.

## 2017-01-07 ENCOUNTER — Ambulatory Visit
Admission: RE | Admit: 2017-01-07 | Discharge: 2017-01-07 | Disposition: A | Discharge status: Home / Self Care | Payer: 59 | Source: Ambulatory Visit | Attending: Family Medicine | Admitting: Family Medicine

## 2017-01-07 DIAGNOSIS — Z1239 Encounter for other screening for malignant neoplasm of breast: Secondary | ICD-10-CM

## 2017-01-07 DIAGNOSIS — Z1231 Encounter for screening mammogram for malignant neoplasm of breast: Secondary | ICD-10-CM | POA: Diagnosis not present

## 2017-01-09 DIAGNOSIS — H04123 Dry eye syndrome of bilateral lacrimal glands: Secondary | ICD-10-CM | POA: Diagnosis not present

## 2017-02-10 ENCOUNTER — Ambulatory Visit (INDEPENDENT_AMBULATORY_CARE_PROVIDER_SITE_OTHER): Payer: 59 | Admitting: Adult Health

## 2017-02-10 ENCOUNTER — Encounter: Payer: Self-pay | Admitting: Adult Health

## 2017-02-10 VITALS — BP 147/90 | HR 72 | Ht 64.25 in | Wt 168.3 lb

## 2017-02-10 DIAGNOSIS — G479 Sleep disorder, unspecified: Secondary | ICD-10-CM | POA: Diagnosis not present

## 2017-02-10 MED ORDER — ESZOPICLONE 2 MG PO TABS: 2.0000 mg | ORAL_TABLET | Freq: Every evening | ORAL | 1 refills | Status: DC | PRN

## 2017-02-10 NOTE — Patient Instructions (Addendum)
Insomnia Insomnia is a sleep disorder that makes it difficult to fall asleep or to stay asleep. Insomnia can cause tiredness (fatigue), low energy, difficulty concentrating, mood swings, and poor performance at work or school. There are three different ways to classify insomnia:  Difficulty falling asleep.  Difficulty staying asleep.  Waking up too early in the morning. Any type of insomnia can be long-term (chronic) or short-term (acute). Both are common. Short-term insomnia usually lasts for three months or less. Chronic insomnia occurs at least three times a week for longer than three months. What are the causes? Insomnia may be caused by another condition, situation, or substance, such as:  Anxiety.  Certain medicines.  Gastroesophageal reflux disease (GERD) or other gastrointestinal conditions.  Asthma or other breathing conditions.  Restless legs syndrome, sleep apnea, or other sleep disorders.  Chronic pain.  Menopause. This may include hot flashes.  Stroke.  Abuse of alcohol, tobacco, or illegal drugs.  Depression.  Caffeine.  Neurological disorders, such as Alzheimer disease.  An overactive thyroid (hyperthyroidism). The cause of insomnia may not be known. What increases the risk? Risk factors for insomnia include:  Gender. Women are more commonly affected than men.  Age. Insomnia is more common as you get older.  Stress. This may involve your professional or personal life.  Income. Insomnia is more common in people with lower income.  Lack of exercise.  Irregular work schedule or night shifts.  Traveling between different time zones. What are the signs or symptoms? If you have insomnia, trouble falling asleep or trouble staying asleep is the main symptom. This may lead to other symptoms, such as:  Feeling fatigued.  Feeling nervous about going to sleep.  Not feeling rested in the morning.  Having trouble concentrating.  Feeling irritable,  anxious, or depressed. How is this treated? Treatment for insomnia depends on the cause. If your insomnia is caused by an underlying condition, treatment will focus on addressing the condition. Treatment may also include:  Medicines to help you sleep.  Counseling or therapy.  Lifestyle adjustments. Follow these instructions at home:  Take medicines only as directed by your health care provider.  Keep regular sleeping and waking hours. Avoid naps.  Keep a sleep diary to help you and your health care provider figure out what could be causing your insomnia. Include:  When you sleep.  When you wake up during the night.  How well you sleep.  How rested you feel the next day.  Any side effects of medicines you are taking.  What you eat and drink.  Make your bedroom a comfortable place where it is easy to fall asleep:  Put up shades or special blackout curtains to block light from outside.  Use a white noise machine to block noise.  Keep the temperature cool.  Exercise regularly as directed by your health care provider. Avoid exercising right before bedtime.  Use relaxation techniques to manage stress. Ask your health care provider to suggest some techniques that may work well for you. These may include:  Breathing exercises.  Routines to release muscle tension.  Visualizing peaceful scenes.  Cut back on alcohol, caffeinated beverages, and cigarettes, especially close to bedtime. These can disrupt your sleep.  Do not overeat or eat spicy foods right before bedtime. This can lead to digestive discomfort that can make it hard for you to sleep.  Limit screen use before bedtime. This includes:  Watching TV.  Using your smartphone, tablet, and computer.  Stick to a   routine. This can help you fall asleep faster. Try to do a quiet activity, brush your teeth, and go to bed at the same time each night.  Get out of bed if you are still awake after 15 minutes of trying to  sleep. Keep the lights down, but try reading or doing a quiet activity. When you feel sleepy, go back to bed.  Make sure that you drive carefully. Avoid driving if you feel very sleepy.  Keep all follow-up appointments as directed by your health care provider. This is important. Contact a health care provider if:  You are tired throughout the day or have trouble in your daily routine due to sleepiness.  You continue to have sleep problems or your sleep problems get worse. Get help right away if:  You have serious thoughts about hurting yourself or someone else. This information is not intended to replace advice given to you by your health care provider. Make sure you discuss any questions you have with your health care provider. Document Released: 11/22/2000 Document Revised: 04/26/2016 Document Reviewed: 08/26/2014 Elsevier Interactive Patient Education  2017 [employer]   Increase regular exercise. Practice good sleep hygiene. Utilize bedtime meditation programs-YouTube and OMG Meditate. Do not take Lorazepam with Lunesta. Please contact clinic in 2-3 weeks and let us know how you are tolerating medication. Use medication to get back on regular sleep cycle, only use as needed.

## 2017-02-10 NOTE — Progress Notes (Signed)
Subjective:    Patient ID: Kimberly Vang, female    DOB: 07-Jun-1956, 61 y.o.   MRN: [id number]  HPI :  Kimberly Vang presents for f/u for depression/anxeity and insomnia.  Recent increase of Escitalopram to 20mg  daily, has helped reduce anxiety and she denies medication SE.  She denies CP/dyspnea/dizziness/HA/thoughts of harming herself/others.  She continues to struggle with insomnia, especially waking 0200-0300 and then racing thoughts keep her awake.  She reports that the PRN Lorazepam "did not help and I often felt like my heart was going to jump out of my chest". 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     Patient Care Team    Relationship Specialty Notifications Start End  Mellody Dance, DO PCP - General Family Medicine  11/08/16     Patient Active Problem List   Diagnosis Date Noted  . Acute non-recurrent ethmoidal sinusitis 01/06/2017  . Acute nonintractable headache 01/06/2017  . Overweight (BMI 25.0-29.9) 07/13/2016  . Sleeping difficulties- due to stress 07/13/2016  . Myalgia and myositis 07/10/2016  . Chronic tension headaches 06/30/2016  . Adjustment disorder with mixed anxiety and depressed mood 06/30/2016  . Chest pain 01/12/2016  . Abnormal stress test 01/12/2016     Past Medical History:  Diagnosis Date  . Adjustment disorder with mixed anxiety and depressed mood 06/30/2016  . Chronic tension headaches 06/30/2016  . Depression 07/01/2011     Past Surgical History:  Procedure Laterality Date  . BREAST ENHANCEMENT SURGERY  1989  . left wrist surgery  2003  . tonsiectomy  1965     Family History  Problem Relation Age of Onset  . Arthritis Mother   . Coronary artery disease Father   . Transient ischemic attack Father   . Cancer Father   . Stroke Father   . Cancer Sister      History  Drug Use No     History  Alcohol Use  . 6.0 oz/week  . 10  Glasses of wine per week     History  Smoking Status  . Never Smoker  Smokeless Tobacco  . Never Used     Outpatient Encounter Prescriptions as of 02/10/2017  Medication Sig Note  . cyclobenzaprine (FLEXERIL) 10 MG tablet Take 1 tablet (10 mg total) by mouth 3 (three) times daily as needed for muscle spasms.   Marland Kitchen escitalopram (LEXAPRO) 20 MG tablet One tab daily   . fluticasone (FLONASE) 50 MCG/ACT nasal spray Place 2 sprays into both nostrils daily.   Marland Kitchen LORazepam (ATIVAN) 0.5 MG tablet Take 0.5 mg by mouth every 8 (eight) hours as needed for anxiety.   . LOTEMAX 0.5 % GEL Place 1 application into both eyes 2 (two) times daily. 12/10/2016: Received from: External Pharmacy  . [DISCONTINUED] doxycycline (VIBRA-TABS) 100 MG tablet Take 1 tablet (100 mg total) by mouth 2 (two) times daily.   . eszopiclone (LUNESTA) 2 MG TABS tablet Take 1 tablet (2 mg total) by mouth at bedtime as needed for sleep. Take immediately before bedtime    No facility-administered encounter medications on file as of 02/10/2017.     Allergies: Patient has no known allergies.  Body mass index is 28.66 kg/m.  Blood pressure (!) 147/90, pulse 72, height 5' 4.25" (1.632 m), weight 168 lb 4.8 oz (76.3 kg), last menstrual period 10/16/2011.    Review of Systems  Constitutional: Positive for  fatigue. Negative for activity change, appetite change, chills, diaphoresis, fever and unexpected weight change.  Eyes: Negative for visual disturbance.  Respiratory: Negative for cough, chest tightness and shortness of breath.   Cardiovascular: Negative for chest pain, palpitations and leg swelling.  Gastrointestinal: Negative for abdominal distention, abdominal pain, constipation, diarrhea, nausea and vomiting.  Endocrine: Negative for cold intolerance, heat intolerance, polydipsia, polyphagia and polyuria.  Skin: Negative for color change, pallor, rash and wound.  Neurological: Negative for dizziness, tremors,  light-headedness and headaches.  Psychiatric/Behavioral: Positive for sleep disturbance. Negative for agitation, behavioral problems, confusion, decreased concentration, dysphoric mood, hallucinations, self-injury and suicidal ideas. The patient is nervous/anxious. The patient is not hyperactive.        Objective:   Physical Exam  Constitutional: She is oriented to person, place, and time. She appears well-developed and well-nourished. No distress.  Eyes: Conjunctivae are normal. Pupils are equal, round, and reactive to light.  Cardiovascular: Normal rate, regular rhythm, normal heart sounds and intact distal pulses.   No murmur heard. Pulmonary/Chest: Effort normal and breath sounds normal. No respiratory distress. She has no wheezes. She has no rales. She exhibits no tenderness.  Neurological: She is alert and oriented to person, place, and time.  Skin: Skin is warm and dry. No rash noted. She is not diaphoretic. No erythema. No pallor.  Psychiatric: She has a normal mood and affect. Her behavior is normal. Judgment and thought content normal.  Nursing note and vitals reviewed.         Assessment & Plan:   1. Sleeping difficulties- due to stress     Sleeping difficulties- due to stress Started on Escitalopram as needed for insomnia. Increase regular exercise. Practice sleep hygiene. Utilize bedtime meditation programs-YouTube and OMG Meditation. Do not use Lorazepam when taking Eszopiclone. Use Esxopiclone nightly for a few weeks, then as needed.  We are trying to get you back on normal sleep pattern and reduce the early waking. Please call clinic in 2-3 weeks and let us know how you sleeping, etc.     FOLLOW-UP:  Return if symptoms worsen or fail to improve.

## 2017-02-10 NOTE — Assessment & Plan Note (Signed)
Started on Escitalopram as needed for insomnia. Increase regular exercise. Practice sleep hygiene. Utilize bedtime meditation programs-YouTube and OMG Meditation. Do not use Lorazepam when taking Eszopiclone. Use Esxopiclone nightly for a few weeks, then as needed.  We are trying to get you back on normal sleep pattern and reduce the early waking. Please call clinic in 2-3 weeks and let us know how you sleeping, etc.

## 2017-02-11 MED FILL — ESZOPICLONE 2 MG TABLET: 2 | 30 days supply | Qty: 30 | Fill #0

## 2017-03-24 MED FILL — ESCITALOPRAM 20 MG TABLET: 20 | 90 days supply | Qty: 90 | Fill #0

## 2017-03-24 MED FILL — LOTEMAX 0.5% GEL: 0.5 | 10 days supply | Qty: 5 | Fill #0

## 2017-04-24 MED FILL — LOTEMAX 0.5% GEL: 0.5 | 10 days supply | Qty: 5 | Fill #1

## 2017-04-28 MED FILL — ESZOPICLONE 2 MG TABLET: 2 | 30 days supply | Qty: 30 | Fill #1

## 2017-04-28 MED FILL — FLUTICASONE PROP 50 MCG SPR: 50 | 30 days supply | Qty: 16 | Fill #1

## 2017-05-09 ENCOUNTER — Telehealth: Payer: Self-pay | Admitting: Family Medicine

## 2017-05-09 NOTE — Telephone Encounter (Signed)
Pt called Friday 6/1 left request for doctor or MA to contact her at [phone] did (Not) leave reason or disclose illness. --glh

## 2017-05-12 ENCOUNTER — Ambulatory Visit (INDEPENDENT_AMBULATORY_CARE_PROVIDER_SITE_OTHER): Payer: 59 | Admitting: Adult Health

## 2017-05-12 ENCOUNTER — Encounter: Payer: Self-pay | Admitting: Adult Health

## 2017-05-12 VITALS — BP 139/85 | HR 82 | Ht 64.25 in | Wt 160.8 lb

## 2017-05-12 DIAGNOSIS — F4323 Adjustment disorder with mixed anxiety and depressed mood: Secondary | ICD-10-CM | POA: Diagnosis not present

## 2017-05-12 NOTE — Telephone Encounter (Signed)
Left patient message.  She actually was in the office when done.  She will discuss issues with provider.

## 2017-05-12 NOTE — Patient Instructions (Signed)
Major Depressive Disorder, Adult Major depressive disorder (MDD) is a mental health condition. It may also be called clinical depression or unipolar depression. MDD usually causes feelings of sadness, hopelessness, or helplessness. MDD can also cause physical symptoms. It can interfere with work, school, relationships, and other everyday activities. MDD may be mild, moderate, or severe. It may occur once (single episode major depressive disorder) or it may occur multiple times (recurrent major depressive disorder). What are the causes? The exact cause of this condition is not known. MDD is most likely caused by a combination of things, which may include:  Genetic factors. These are traits that are passed along from parent to child.  Individual factors. Your personality, your behavior, and the way you handle your thoughts and feelings may contribute to MDD. This includes personality traits and behaviors learned from others.  Physical factors, such as: ? Differences in the part of your brain that controls emotion. This part of your brain may be different than it is in people who do not have MDD. ? Long-term (chronic) medical or psychiatric illnesses.  Social factors. Traumatic experiences or major life changes may play a role in the development of MDD.  What increases the risk? This condition is more likely to develop in women. The following factors may also make you more likely to develop MDD:  A family history of depression.  Troubled family relationships.  Abnormally low levels of certain brain chemicals.  Traumatic events in childhood, especially abuse or the loss of a parent.  Being under a lot of stress, or long-term stress, especially from upsetting life experiences or losses.  A history of: ? Chronic physical illness. ? Other mental health disorders. ? Substance abuse.  Poor living conditions.  Experiencing social exclusion or discrimination on a regular basis.  What are  the signs or symptoms? The main symptoms of MDD typically include:  Constant depressed or irritable mood.  Loss of interest in things and activities.  MDD symptoms may also include:  Sleeping or eating too much or too little.  Unexplained weight change.  Fatigue or low energy.  Feelings of worthlessness or guilt.  Difficulty thinking clearly or making decisions.  Thoughts of suicide or of harming others.  Physical agitation or weakness.  Isolation.  Severe cases of MDD may also occur with other symptoms, such as:  Delusions or hallucinations, in which you imagine things that are not real (psychotic depression).  Low-level depression that lasts at least a year (chronic depression or persistent depressive disorder).  Extreme sadness and hopelessness (melancholic depression).  Trouble speaking and moving (catatonic depression).  How is this diagnosed? This condition may be diagnosed based on:  Your symptoms.  Your medical history, including your mental health history. This may involve tests to evaluate your mental health. You may be asked questions about your lifestyle, including any drug and alcohol use, and how long you have had symptoms of MDD.  A physical exam.  Blood tests to rule out other conditions.  You must have a depressed mood and at least four other MDD symptoms most of the day, nearly every day in the same 2-week timeframe before your health care provider can confirm a diagnosis of MDD. How is this treated? This condition is usually treated by mental health professionals, such as psychologists, psychiatrists, and clinical social workers. You may need more than one type of treatment. Treatment may include:  Psychotherapy. This is also called talk therapy or counseling. Types of psychotherapy include: ? Cognitive behavioral   therapy (CBT). This type of therapy teaches you to recognize unhealthy feelings, thoughts, and behaviors, and replace them with  positive thoughts and actions. ? Interpersonal therapy (IPT). This helps you to improve the way you relate to and communicate with others. ? Family therapy. This treatment includes members of your family.  Medicine to treat anxiety and depression, or to help you control certain emotions and behaviors.  Lifestyle changes, such as: ? Limiting alcohol and drug use. ? Exercising regularly. ? Getting plenty of sleep. ? Making healthy eating choices. ? Spending more time outdoors.  Treatments involving stimulation of the brain can be used in situations with extremely severe symptoms, or when medicine or other therapies do not work over time. These treatments include electroconvulsive therapy, transcranial magnetic stimulation, and vagal nerve stimulation. Follow these instructions at home: Activity  Return to your normal activities as told by your health care provider.  Exercise regularly and spend time outdoors as told by your health care provider. General instructions  Take over-the-counter and prescription medicines only as told by your health care provider.  Do not drink alcohol. If you drink alcohol, limit your alcohol intake to no more than 1 drink a day for nonpregnant women and 2 drinks a day for men. One drink equals 12 oz of beer, 5 oz of wine, or 1 oz of hard liquor. Alcohol can affect any antidepressant medicines you are taking. Talk to your health care provider about your alcohol use.  Eat a healthy diet and get plenty of sleep.  Find activities that you enjoy doing, and make time to do them.  Consider joining a support group. Your health care provider may be able to recommend a support group.  Keep all follow-up visits as told by your health care provider. This is important. Where to find more information: [employer] on Mental Illness  www.nami.org  [place] National Institute of Mental Health  https://carter.com/  National Suicide Prevention  Lifeline  1-800-273-TALK 202-208-2398). This is free, 24-hour help.  Contact a health care provider if:  Your symptoms get worse.  You develop new symptoms. Get help right away if:  You self-harm.  You have serious thoughts about hurting yourself or others.  You see, hear, taste, smell, or feel things that are not present (hallucinate). This information is not intended to replace advice given to you by your health care provider. Make sure you discuss any questions you have with your health care provider. Document Released: 03/22/2013 Document Revised: 08/01/2016 Document Reviewed: 06/05/2016 Elsevier Interactive Patient Education  2017 [place].   Generalized Anxiety Disorder, Adult Generalized anxiety disorder (GAD) is a mental health disorder. People with this condition constantly worry about everyday events. Unlike normal anxiety, worry related to GAD is not triggered by a specific event. These worries also do not fade or get better with time. GAD interferes with life functions, including relationships, work, and school. GAD can vary from mild to severe. People with severe GAD can have intense waves of anxiety with physical symptoms (panic attacks). What are the causes? The exact cause of GAD is not known. What increases the risk? This condition is more likely to develop in:  Women.  People who have a family history of anxiety disorders.  People who are very shy.  People who experience very stressful life events, such as the death of a loved one.  People who have a very stressful family environment.  What are the signs or symptoms? People with GAD often worry excessively about  many things in their lives, such as their health and family. They may also be overly concerned about:  Doing well at work.  Being on time.  Natural disasters.  Friendships.  Physical symptoms of GAD include:  Fatigue.  Muscle tension or having muscle twitches.  Trembling or feeling  shaky.  Being easily startled.  Feeling like your heart is pounding or racing.  Feeling out of breath or like you cannot take a deep breath.  Having trouble falling asleep or staying asleep.  Sweating.  Nausea, diarrhea, or irritable bowel syndrome (IBS).  Headaches.  Trouble concentrating or remembering facts.  Restlessness.  Irritability.  How is this diagnosed? Your health care provider can diagnose GAD based on your symptoms and medical history. You will also have a physical exam. The health care provider will ask specific questions about your symptoms, including how severe they are, when they started, and if they come and go. Your health care provider may ask you about your use of alcohol or drugs, including prescription medicines. Your health care provider may refer you to a mental health specialist for further evaluation. Your health care provider will do a thorough examination and may perform additional tests to rule out other possible causes of your symptoms. To be diagnosed with GAD, a person must have anxiety that:  Is out of his or her control.  Affects several different aspects of his or her life, such as work and relationships.  Causes distress that makes him or her unable to take part in normal activities.  Includes at least three physical symptoms of GAD, such as restlessness, fatigue, trouble concentrating, irritability, muscle tension, or sleep problems.  Before your health care provider can confirm a diagnosis of GAD, these symptoms must be present more days than they are not, and they must last for six months or longer. How is this treated? The following therapies are usually used to treat GAD:  Medicine. Antidepressant medicine is usually prescribed for long-term daily control. Antianxiety medicines may be added in severe cases, especially when panic attacks occur.  Talk therapy (psychotherapy). Certain types of talk therapy can be helpful in treating GAD  by providing support, education, and guidance. Options include: ? Cognitive behavioral therapy (CBT). People learn coping skills and techniques to ease their anxiety. They learn to identify unrealistic or negative thoughts and behaviors and to replace them with positive ones. ? Acceptance and commitment therapy (ACT). This treatment teaches people how to be mindful as a way to cope with unwanted thoughts and feelings. ? Biofeedback. This process trains you to manage your body's response (physiological response) through breathing techniques and relaxation methods. You will work with a therapist while machines are used to monitor your physical symptoms.  Stress management techniques. These include yoga, meditation, and exercise.  A mental health specialist can help determine which treatment is best for you. Some people see improvement with one type of therapy. However, other people require a combination of therapies. Follow these instructions at home:  Take over-the-counter and prescription medicines only as told by your health care provider.  Try to maintain a normal routine.  Try to anticipate stressful situations and allow extra time to manage them.  Practice any stress management or self-calming techniques as taught by your health care provider.  Do not punish yourself for setbacks or for not making progress.  Try to recognize your accomplishments, even if they are small.  Keep all follow-up visits as told by your health care provider. This is  important. Contact a health care provider if:  Your symptoms do not get better.  Your symptoms get worse.  You have signs of depression, such as: ? A persistently sad, cranky, or irritable mood. ? Loss of enjoyment in activities that used to bring you joy. ? Change in weight or eating. ? Changes in sleeping habits. ? Avoiding friends or family members. ? Loss of energy for normal tasks. ? Feelings of guilt or worthlessness. Get help right  away if:  You have serious thoughts about hurting yourself or others. If you ever feel like you may hurt yourself or others, or have thoughts about taking your own life, get help right away. You can go to your nearest emergency department or call:  Your local emergency services (911 in the [place]  A suicide crisis helpline, such as the [facility] at 6153537907. This is open 24 hours a day.  Summary  Generalized anxiety disorder (GAD) is a mental health disorder that involves worry that is not triggered by a specific event.  People with GAD often worry excessively about many things in their lives, such as their health and family.  GAD may cause physical symptoms such as restlessness, trouble concentrating, sleep problems, frequent sweating, nausea, diarrhea, headaches, and trembling or muscle twitching.  A mental health specialist can help determine which treatment is best for you. Some people see improvement with one type of therapy. However, other people require a combination of therapies. This information is not intended to replace advice given to you by your health care provider. Make sure you discuss any questions you have with your health care provider. Document Released: 03/22/2013 Document Revised: 10/15/2016 Document Reviewed: 10/15/2016 Elsevier Interactive Patient Education  2018 [employer].  Continue medications as directed. It is great that you are seeing your therapist today-please see her as often as you can. Continue healthy eating, reduce wine consumption. Increase daily movement, I.e walking, swimming, biking, gardening. Please call clinic with ANY questions/concerns.
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
## 2017-05-12 NOTE — Assessment & Plan Note (Addendum)
Since she has been on Escitalopram  For > 6 months, since weaning off safely would take > 4 weeks we have decided to continue Escitalopram 20 mg daily. Last day at current job- 15 June 18- this should greatly reduce stress/anxiety. Increase regular movement to at least 37mins/5 times a week. Continue healthy eating and excellent water consumption. Estimates to drink 2-4 glasses wine/night.  This is above CDC safe drinking guidelines and we discussed the importance of reducing ETOH intake (she is not driving while drinking and she denies ETOH is causing problems in interpersonal relationships/employment). We discussed how ETOH can exacerbate depression and how reducing/stopping would be beneficial. She has appt with therapist Leamon Arnt today-GREAT. She has strong support from her husband "[name]". F/u PRN.

## 2017-07-13 MED FILL — ESCITALOPRAM 20 MG TABLET: 20 | 90 days supply | Qty: 90 | Fill #1

## 2017-07-13 MED FILL — FLUTICASONE PROP 50 MCG SPR: 50 | 30 days supply | Qty: 16 | Fill #2

## 2017-07-14 MED FILL — LOTEMAX 0.5% GEL: 0.5 | 10 days supply | Qty: 5 | Fill #2

## 2017-11-04 ENCOUNTER — Other Ambulatory Visit: Payer: Self-pay | Admitting: Family Medicine

## 2017-11-04 DIAGNOSIS — G479 Sleep disorder, unspecified: Secondary | ICD-10-CM

## 2017-11-04 DIAGNOSIS — F4323 Adjustment disorder with mixed anxiety and depressed mood: Secondary | ICD-10-CM

## 2017-11-04 MED FILL — ESCITALOPRAM 20 MG TABLET: 20 | 90 days supply | Qty: 90 | Fill #0

## 2017-12-26 ENCOUNTER — Encounter: Payer: Self-pay | Admitting: Family Medicine

## 2017-12-26 ENCOUNTER — Ambulatory Visit (INDEPENDENT_AMBULATORY_CARE_PROVIDER_SITE_OTHER): Payer: 59 | Admitting: Family Medicine

## 2017-12-26 VITALS — BP 123/84 | HR 78 | Ht 64.25 in | Wt 161.8 lb

## 2017-12-26 DIAGNOSIS — Z7189 Other specified counseling: Secondary | ICD-10-CM

## 2017-12-26 DIAGNOSIS — K219 Gastro-esophageal reflux disease without esophagitis: Secondary | ICD-10-CM | POA: Diagnosis not present

## 2017-12-26 DIAGNOSIS — Z0001 Encounter for general adult medical examination with abnormal findings: Secondary | ICD-10-CM

## 2017-12-26 DIAGNOSIS — Z1283 Encounter for screening for malignant neoplasm of skin: Secondary | ICD-10-CM | POA: Diagnosis not present

## 2017-12-26 MED ORDER — OMEPRAZOLE 20 MG PO CPDR: 20.0000 mg | DELAYED_RELEASE_CAPSULE | Freq: Every day | ORAL | 1 refills | Status: AC

## 2017-12-26 MED FILL — OMEPRAZOLE 20 MG CAP: 20 | 90 days supply | Qty: 90 | Fill #0

## 2017-12-26 NOTE — Patient Instructions (Signed)
Preventive Care for Adults, Female  A healthy lifestyle and preventive care can promote health and wellness. Preventive health guidelines for women include the following key practices.   A routine yearly physical is a good way to check with your health care provider about your health and preventive screening. It is a chance to share any concerns and updates on your health and to receive a thorough exam.   Visit your dentist for a routine exam and preventive care every 6 months. Brush your teeth twice a day and floss once a day. Good oral hygiene prevents tooth decay and gum disease.   The frequency of eye exams is based on your age, health, family medical history, use of contact lenses, and other factors. Follow your health care provider's recommendations for frequency of eye exams.   Eat a healthy diet. Foods like vegetables, fruits, whole grains, low-fat dairy products, and lean protein foods contain the nutrients you need without too many calories. Decrease your intake of foods high in solid fats, added sugars, and salt. Eat the right amount of calories for you.Get information about a proper diet from your health care provider, if necessary.   Regular physical exercise is one of the most important things you can do for your health. Most adults should get at least 150 minutes of moderate-intensity exercise (any activity that increases your heart rate and causes you to sweat) each week. In addition, most adults need muscle-strengthening exercises on 2 or more days a week.   Maintain a healthy weight. The body mass index (BMI) is a screening tool to identify possible weight problems. It provides an estimate of body fat based on height and weight. Your health care provider can find your BMI, and can help you achieve or maintain a healthy weight.For adults 20 years and older:   - A BMI below 18.5 is considered underweight.   - A BMI of 18.5 to 24.9 is normal.   - A BMI of 25 to 29.9 is  considered overweight.   - A BMI of 30 and above is considered obese.   Maintain normal blood lipids and cholesterol levels by exercising and minimizing your intake of trans and saturated fats.  Eat a balanced diet with plenty of fruit and vegetables. Blood tests for lipids and cholesterol should begin at age 20 and be repeated every 5 years minimum.  If your lipid or cholesterol levels are high, you are over 40, or you are at high risk for heart disease, you may need your cholesterol levels checked more frequently.Ongoing high lipid and cholesterol levels should be treated with medicines if diet and exercise are not working.   If you smoke, find out from your health care provider how to quit. If you do not use tobacco, do not start.   Lung cancer screening is recommended for adults aged 55-80 years who are at high risk for developing lung cancer because of a history of smoking. A yearly low-dose CT scan of the lungs is recommended for people who have at least a 30-pack-year history of smoking and are a current smoker or have quit within the past 15 years. A pack year of smoking is smoking an average of 1 pack of cigarettes a day for 1 year (for example: 1 pack a day for 30 years or 2 packs a day for 15 years). Yearly screening should continue until the smoker has stopped smoking for at least 15 years. Yearly screening should be stopped for people who develop a   health problem that would prevent them from having lung cancer treatment.   If you are pregnant, do not drink alcohol. If you are breastfeeding, be very cautious about drinking alcohol. If you are not pregnant and choose to drink alcohol, do not have more than 1 drink per day. One drink is considered to be 12 ounces (355 mL) of beer, 5 ounces (148 mL) of wine, or 1.5 ounces (44 mL) of liquor.   Avoid use of street drugs. Do not share needles with anyone. Ask for help if you need support or instructions about stopping the use of  drugs.   High blood pressure causes heart disease and increases the risk of stroke. Your blood pressure should be checked at least yearly.  Ongoing high blood pressure should be treated with medicines if weight loss and exercise do not work.   If you are 69-55 years old, ask your health care provider if you should take aspirin to prevent strokes.   Diabetes screening involves taking a blood sample to check your fasting blood sugar level. This should be done once every 3 years, after age 38, if you are within normal weight and without risk factors for diabetes. Testing should be considered at a younger age or be carried out more frequently if you are overweight and have at least 1 risk factor for diabetes.   Breast cancer screening is essential preventive care for women. You should practice "breast self-awareness."  This means understanding the normal appearance and feel of your breasts and may include breast self-examination.  Any changes detected, no matter how small, should be reported to a health care provider.  Women in their 80s and 30s should have a clinical breast exam (CBE) by a health care provider as part of a regular health exam every 1 to 3 years.  After age 66, women should have a CBE every year.  Starting at age 1, women should consider having a mammogram (breast X-ray test) every year.  Women who have a family history of breast cancer should talk to their health care provider about genetic screening.  Women at a high risk of breast cancer should talk to their health care providers about having an MRI and a mammogram every year.   -Breast cancer gene (BRCA)-related cancer risk assessment is recommended for women who have family members with BRCA-related cancers. BRCA-related cancers include breast, ovarian, tubal, and peritoneal cancers. Having family members with these cancers may be associated with an increased risk for harmful changes (mutations) in the breast cancer genes BRCA1 and  BRCA2. Results of the assessment will determine the need for genetic counseling and BRCA1 and BRCA2 testing.   The Pap test is a screening test for cervical cancer. A Pap test can show cell changes on the cervix that might become cervical cancer if left untreated. A Pap test is a procedure in which cells are obtained and examined from the lower end of the uterus (cervix).   - Women should have a Pap test starting at age 57.   - Between ages 90 and 70, Pap tests should be repeated every 2 years.   - Beginning at age 63, you should have a Pap test every 3 years as long as the past 3 Pap tests have been normal.   - Some women have medical problems that increase the chance of getting cervical cancer. Talk to your health care provider about these problems. It is especially important to talk to your health care provider if a  new problem develops soon after your last Pap test. In these cases, your health care provider may recommend more frequent screening and Pap tests.   - The above recommendations are the same for women who have or have not gotten the vaccine for human papillomavirus (HPV).   - If you had a hysterectomy for a problem that was not cancer or a condition that could lead to cancer, then you no longer need Pap tests. Even if you no longer need a Pap test, a regular exam is a good idea to make sure no other problems are starting.   - If you are between ages 36 and 66 years, and you have had normal Pap tests going back 10 years, you no longer need Pap tests. Even if you no longer need a Pap test, a regular exam is a good idea to make sure no other problems are starting.   - If you have had past treatment for cervical cancer or a condition that could lead to cancer, you need Pap tests and screening for cancer for at least 20 years after your treatment.   - If Pap tests have been discontinued, risk factors (such as a new sexual partner) need to be reassessed to determine if screening should  be resumed.   - The HPV test is an additional test that may be used for cervical cancer screening. The HPV test looks for the virus that can cause the cell changes on the cervix. The cells collected during the Pap test can be tested for HPV. The HPV test could be used to screen women aged 70 years and older, and should be used in women of any age who have unclear Pap test results. After the age of 67, women should have HPV testing at the same frequency as a Pap test.   Colorectal cancer can be detected and often prevented. Most routine colorectal cancer screening begins at the age of 57 years and continues through age 26 years. However, your health care provider may recommend screening at an earlier age if you have risk factors for colon cancer. On a yearly basis, your health care provider may provide home test kits to check for hidden blood in the stool.  Use of a small camera at the end of a tube, to directly examine the colon (sigmoidoscopy or colonoscopy), can detect the earliest forms of colorectal cancer. Talk to your health care provider about this at age 23, when routine screening begins. Direct exam of the colon should be repeated every 5 -10 years through age 49 years, unless early forms of pre-cancerous polyps or small growths are found.   People who are at an increased risk for hepatitis B should be screened for this virus. You are considered at high risk for hepatitis B if:  -You were born in a country where hepatitis B occurs often. Talk with your health care provider about which countries are considered high risk.  - Your parents were born in a high-risk country and you have not received a shot to protect against hepatitis B (hepatitis B vaccine).  - You have HIV or AIDS.  - You use needles to inject street drugs.  - You live with, or have sex with, someone who has Hepatitis B.  - You get hemodialysis treatment.  - You take certain medicines for conditions like cancer, organ  transplantation, and autoimmune conditions.   Hepatitis C blood testing is recommended for all people born from [birth year] through 1965 and any individual  with known risks for hepatitis C.   Practice safe sex. Use condoms and avoid high-risk sexual practices to reduce the spread of sexually transmitted infections (STIs). STIs include gonorrhea, chlamydia, syphilis, trichomonas, herpes, HPV, and human immunodeficiency virus (HIV). Herpes, HIV, and HPV are viral illnesses that have no cure. They can result in disability, cancer, and death. Sexually active women aged 25 years and younger should be checked for chlamydia. Older women with new or multiple partners should also be tested for chlamydia. Testing for other STIs is recommended if you are sexually active and at increased risk.   Osteoporosis is a disease in which the bones lose minerals and strength with aging. This can result in serious bone fractures or breaks. The risk of osteoporosis can be identified using a bone density scan. Women ages 65 years and over and women at risk for fractures or osteoporosis should discuss screening with their health care providers. Ask your health care provider whether you should take a calcium supplement or vitamin D to There are also several preventive steps women can take to avoid osteoporosis and resulting fractures or to keep osteoporosis from worsening. -->Recommendations include:  Eat a balanced diet high in fruits, vegetables, calcium, and vitamins.  Get enough calcium. The recommended total intake of is 1,200 mg daily; for best absorption, if taking supplements, divide doses into 250-500 mg doses throughout the day. Of the two types of calcium, calcium carbonate is best absorbed when taken with food but calcium citrate can be taken on an empty stomach.  Get enough vitamin D. NAMS and the National Osteoporosis Foundation recommend at least 1,000 IU per day for women age 50 and over who are at risk of vitamin D  deficiency. Vitamin D deficiency can be caused by inadequate sun exposure (for example, those who live in northern latitudes).  Avoid alcohol and smoking. Heavy alcohol intake (more than 7 drinks per week) increases the risk of falls and hip fracture and women smokers tend to lose bone more rapidly and have lower bone mass than nonsmokers. Stopping smoking is one of the most important changes women can make to improve their health and decrease risk for disease.  Be physically active every day. Weight-bearing exercise (for example, fast walking, hiking, jogging, and weight training) may strengthen bones or slow the rate of bone loss that comes with aging. Balancing and muscle-strengthening exercises can reduce the risk of falling and fracture.  Consider therapeutic medications. Currently, several types of effective drugs are available. Healthcare providers can recommend the type most appropriate for each woman.  Eliminate environmental factors that may contribute to accidents. Falls cause nearly 90% of all osteoporotic fractures, so reducing this risk is an important bone-health strategy. Measures include ample lighting, removing obstructions to walking, using nonskid rugs on floors, and placing mats and/or grab bars in showers.  Be aware of medication side effects. Some common medicines make bones weaker. These include a type of steroid drug called glucocorticoids used for arthritis and asthma, some antiseizure drugs, certain sleeping pills, treatments for endometriosis, and some cancer drugs. An overactive thyroid gland or using too much thyroid hormone for an underactive thyroid can also be a problem. If you are taking these medicines, talk to your doctor about what you can do to help protect your bones.reduce the rate of osteoporosis.    Menopause can be associated with physical symptoms and risks. Hormone replacement therapy is available to decrease symptoms and risks. You should talk to your  health care provider   about whether hormone replacement therapy is right for you.   Use sunscreen. Apply sunscreen liberally and repeatedly throughout the day. You should seek shade when your shadow is shorter than you. Protect yourself by wearing long sleeves, pants, a wide-brimmed hat, and sunglasses year round, whenever you are outdoors.   Once a month, do a whole body skin exam, using a mirror to look at the skin on your back. Tell your health care provider of new moles, moles that have irregular borders, moles that are larger than a pencil eraser, or moles that have changed in shape or color.   -Stay current with required vaccines (immunizations).   Influenza vaccine. All adults should be immunized every year.  Tetanus, diphtheria, and acellular pertussis (Td, Tdap) vaccine. Pregnant women should receive 1 dose of Tdap vaccine during each pregnancy. The dose should be obtained regardless of the length of time since the last dose. Immunization is preferred during the 27th 36th week of gestation. An adult who has not previously received Tdap or who does not know her vaccine status should receive 1 dose of Tdap. This initial dose should be followed by tetanus and diphtheria toxoids (Td) booster doses every 10 years. Adults with an unknown or incomplete history of completing a 3-dose immunization series with Td-containing vaccines should begin or complete a primary immunization series including a Tdap dose. Adults should receive a Td booster every 10 years.  Varicella vaccine. An adult without evidence of immunity to varicella should receive 2 doses or a second dose if she has previously received 1 dose. Pregnant females who do not have evidence of immunity should receive the first dose after pregnancy. This first dose should be obtained before leaving the health care facility. The second dose should be obtained 4 8 weeks after the first dose.  Human papillomavirus (HPV) vaccine. Females aged 13 26  years who have not received the vaccine previously should obtain the 3-dose series. The vaccine is not recommended for use in pregnant females. However, pregnancy testing is not needed before receiving a dose. If a female is found to be pregnant after receiving a dose, no treatment is needed. In that case, the remaining doses should be delayed until after the pregnancy. Immunization is recommended for any person with an immunocompromised condition through the age of 26 years if she did not get any or all doses earlier. During the 3-dose series, the second dose should be obtained 4 8 weeks after the first dose. The third dose should be obtained 24 weeks after the first dose and 16 weeks after the second dose.  Zoster vaccine. One dose is recommended for adults aged 60 years or older unless certain conditions are present.  Measles, mumps, and rubella (MMR) vaccine. Adults born before 1957 generally are considered immune to measles and mumps. Adults born in 1957 or later should have 1 or more doses of MMR vaccine unless there is a contraindication to the vaccine or there is laboratory evidence of immunity to each of the three diseases. A routine second dose of MMR vaccine should be obtained at least 28 days after the first dose for students attending postsecondary schools, health care workers, or international travelers. People who received inactivated measles vaccine or an unknown type of measles vaccine during 1963 1967 should receive 2 doses of MMR vaccine. People who received inactivated mumps vaccine or an unknown type of mumps vaccine before 1979 and are at high risk for mumps infection should consider immunization with 2 doses of   MMR vaccine. For females of childbearing age, rubella immunity should be determined. If there is no evidence of immunity, females who are not pregnant should be vaccinated. If there is no evidence of immunity, females who are pregnant should delay immunization until after pregnancy.  Unvaccinated health care workers born before [birth year] who lack laboratory evidence of measles, mumps, or rubella immunity or laboratory confirmation of disease should consider measles and mumps immunization with 2 doses of MMR vaccine or rubella immunization with 1 dose of MMR vaccine.  Pneumococcal 13-valent conjugate (PCV13) vaccine. When indicated, a person who is uncertain of her immunization history and has no record of immunization should receive the PCV13 vaccine. An adult aged 54 years or older who has certain medical conditions and has not been previously immunized should receive 1 dose of PCV13 vaccine. This PCV13 should be followed with a dose of pneumococcal polysaccharide (PPSV23) vaccine. The PPSV23 vaccine dose should be obtained at least 8 weeks after the dose of PCV13 vaccine. An adult aged 58 years or older who has certain medical conditions and previously received 1 or more doses of PPSV23 vaccine should receive 1 dose of PCV13. The PCV13 vaccine dose should be obtained 1 or more years after the last PPSV23 vaccine dose.  Pneumococcal polysaccharide (PPSV23) vaccine. When PCV13 is also indicated, PCV13 should be obtained first. All adults aged 58 years and older should be immunized. An adult younger than age 65 years who has certain medical conditions should be immunized. Any person who resides in a nursing home or long-term care facility should be immunized. An adult smoker should be immunized. People with an immunocompromised condition and certain other conditions should receive both PCV13 and PPSV23 vaccines. People with human immunodeficiency virus (HIV) infection should be immunized as soon as possible after diagnosis. Immunization during chemotherapy or radiation therapy should be avoided. Routine use of PPSV23 vaccine is not recommended for American Indians, [place] Natives, or people younger than 65 years unless there are medical conditions that require PPSV23 vaccine. When indicated,  people who have unknown immunization and have no record of immunization should receive PPSV23 vaccine. One-time revaccination 5 years after the first dose of PPSV23 is recommended for people aged 70 64 years who have chronic kidney failure, nephrotic syndrome, asplenia, or immunocompromised conditions. People who received 1 2 doses of PPSV23 before age 32 years should receive another dose of PPSV23 vaccine at age 96 years or later if at least 5 years have passed since the previous dose. Doses of PPSV23 are not needed for people immunized with PPSV23 at or after age 55 years.  Meningococcal vaccine. Adults with asplenia or persistent complement component deficiencies should receive 2 doses of quadrivalent meningococcal conjugate (MenACWY-D) vaccine. The doses should be obtained at least 2 months apart. Microbiologists working with certain meningococcal bacteria, [employer] recruits, people at risk during an outbreak, and people who travel to or live in countries with a high rate of meningitis should be immunized. A first-year college student up through age 58 years who is living in a residence hall should receive a dose if she did not receive a dose on or after her 16th birthday. Adults who have certain high-risk conditions should receive one or more doses of vaccine.  Hepatitis A vaccine. Adults who wish to be protected from this disease, have certain high-risk conditions, work with hepatitis A-infected animals, work in hepatitis A research labs, or travel to or work in countries with a high rate of hepatitis A should be  immunized. Adults who were previously unvaccinated and who anticipate close contact with an international adoptee during the first 60 days after arrival in the [place] from a country with a high rate of hepatitis A should be immunized.  Hepatitis B vaccine.  Adults who wish to be protected from this disease, have certain high-risk conditions, may be exposed to blood or other infectious  body fluids, are household contacts or sex partners of hepatitis B positive people, are clients or workers in certain care facilities, or travel to or work in countries with a high rate of hepatitis B should be immunized.  Haemophilus influenzae type b (Hib) vaccine. A previously unvaccinated person with asplenia or sickle cell disease or having a scheduled splenectomy should receive 1 dose of Hib vaccine. Regardless of previous immunization, a recipient of a hematopoietic stem cell transplant should receive a 3-dose series 6 12 months after her successful transplant. Hib vaccine is not recommended for adults with HIV infection.  Preventive Services / Frequency Ages 6 to 39years  Blood pressure check.** / Every 1 to 2 years.  Lipid and cholesterol check.** / Every 5 years beginning at age 39.  Clinical breast exam.** / Every 3 years for women in their 61s and 62s.  BRCA-related cancer risk assessment.** / For women who have family members with a BRCA-related cancer (breast, ovarian, tubal, or peritoneal cancers).  Pap test.** / Every 2 years from ages 47 through 85. Every 3 years starting at age 34 through age 12 or 74 with a history of 3 consecutive normal Pap tests.  HPV screening.** / Every 3 years from ages 46 through ages 43 to 54 with a history of 3 consecutive normal Pap tests.  Hepatitis C blood test.** / For any individual with known risks for hepatitis C.  Skin self-exam. / Monthly.  Influenza vaccine. / Every year.  Tetanus, diphtheria, and acellular pertussis (Tdap, Td) vaccine.** / Consult your health care provider. Pregnant women should receive 1 dose of Tdap vaccine during each pregnancy. 1 dose of Td every 10 years.  Varicella vaccine.** / Consult your health care provider. Pregnant females who do not have evidence of immunity should receive the first dose after pregnancy.  HPV vaccine. / 3 doses over 6 months, if 64 and younger. The vaccine is not recommended for use in  pregnant females. However, pregnancy testing is not needed before receiving a dose.  Measles, mumps, rubella (MMR) vaccine.** / You need at least 1 dose of MMR if you were born in 1957 or later. You may also need a 2nd dose. For females of childbearing age, rubella immunity should be determined. If there is no evidence of immunity, females who are not pregnant should be vaccinated. If there is no evidence of immunity, females who are pregnant should delay immunization until after pregnancy.  Pneumococcal 13-valent conjugate (PCV13) vaccine.** / Consult your health care provider.  Pneumococcal polysaccharide (PPSV23) vaccine.** / 1 to 2 doses if you smoke cigarettes or if you have certain conditions.  Meningococcal vaccine.** / 1 dose if you are age 71 to 37 years and a Market researcher living in a residence hall, or have one of several medical conditions, you need to get vaccinated against meningococcal disease. You may also need additional booster doses.  Hepatitis A vaccine.** / Consult your health care provider.  Hepatitis B vaccine.** / Consult your health care provider.  Haemophilus influenzae type b (Hib) vaccine.** / Consult your health care provider.  Ages 55 to 64years  Blood pressure check.** / Every 1 to 2 years.  Lipid and cholesterol check.** / Every 5 years beginning at age 20 years.  Lung cancer screening. / Every year if you are aged 55 80 years and have a 30-pack-year history of smoking and currently smoke or have quit within the past 15 years. Yearly screening is stopped once you have quit smoking for at least 15 years or develop a health problem that would prevent you from having lung cancer treatment.  Clinical breast exam.** / Every year after age 40 years.  BRCA-related cancer risk assessment.** / For women who have family members with a BRCA-related cancer (breast, ovarian, tubal, or peritoneal cancers).  Mammogram.** / Every year beginning at age 40  years and continuing for as long as you are in good health. Consult with your health care provider.  Pap test.** / Every 3 years starting at age 30 years through age 65 or 70 years with a history of 3 consecutive normal Pap tests.  HPV screening.** / Every 3 years from ages 30 years through ages 65 to 70 years with a history of 3 consecutive normal Pap tests.  Fecal occult blood test (FOBT) of stool. / Every year beginning at age 50 years and continuing until age 75 years. You may not need to do this test if you get a colonoscopy every 10 years.  Flexible sigmoidoscopy or colonoscopy.** / Every 5 years for a flexible sigmoidoscopy or every 10 years for a colonoscopy beginning at age 50 years and continuing until age 75 years.  Hepatitis C blood test.** / For all people born from 1945 through 1965 and any individual with known risks for hepatitis C.  Skin self-exam. / Monthly.  Influenza vaccine. / Every year.  Tetanus, diphtheria, and acellular pertussis (Tdap/Td) vaccine.** / Consult your health care provider. Pregnant women should receive 1 dose of Tdap vaccine during each pregnancy. 1 dose of Td every 10 years.  Varicella vaccine.** / Consult your health care provider. Pregnant females who do not have evidence of immunity should receive the first dose after pregnancy.  Zoster vaccine.** / 1 dose for adults aged 60 years or older.  Measles, mumps, rubella (MMR) vaccine.** / You need at least 1 dose of MMR if you were born in 1957 or later. You may also need a 2nd dose. For females of childbearing age, rubella immunity should be determined. If there is no evidence of immunity, females who are not pregnant should be vaccinated. If there is no evidence of immunity, females who are pregnant should delay immunization until after pregnancy.  Pneumococcal 13-valent conjugate (PCV13) vaccine.** / Consult your health care provider.  Pneumococcal polysaccharide (PPSV23) vaccine.** / 1 to 2 doses if  you smoke cigarettes or if you have certain conditions.  Meningococcal vaccine.** / Consult your health care provider.  Hepatitis A vaccine.** / Consult your health care provider.  Hepatitis B vaccine.** / Consult your health care provider.  Haemophilus influenzae type b (Hib) vaccine.** / Consult your health care provider.  Ages 65 years and over  Blood pressure check.** / Every 1 to 2 years.  Lipid and cholesterol check.** / Every 5 years beginning at age 20 years.  Lung cancer screening. / Every year if you are aged 55 80 years and have a 30-pack-year history of smoking and currently smoke or have quit within the past 15 years. Yearly screening is stopped once you have quit smoking for at least 15 years or develop a health problem that   would prevent you from having lung cancer treatment.  Clinical breast exam.** / Every year after age 103 years.  BRCA-related cancer risk assessment.** / For women who have family members with a BRCA-related cancer (breast, ovarian, tubal, or peritoneal cancers).  Mammogram.** / Every year beginning at age 36 years and continuing for as long as you are in good health. Consult with your health care provider.  Pap test.** / Every 3 years starting at age 5 years through age 85 or 10 years with 3 consecutive normal Pap tests. Testing can be stopped between 65 and 70 years with 3 consecutive normal Pap tests and no abnormal Pap or HPV tests in the past 10 years.  HPV screening.** / Every 3 years from ages 93 years through ages 70 or 45 years with a history of 3 consecutive normal Pap tests. Testing can be stopped between 65 and 70 years with 3 consecutive normal Pap tests and no abnormal Pap or HPV tests in the past 10 years.  Fecal occult blood test (FOBT) of stool. / Every year beginning at age 8 years and continuing until age 45 years. You may not need to do this test if you get a colonoscopy every 10 years.  Flexible sigmoidoscopy or colonoscopy.** /  Every 5 years for a flexible sigmoidoscopy or every 10 years for a colonoscopy beginning at age 69 years and continuing until age 68 years.  Hepatitis C blood test.** / For all people born from [birth year] through 1965 and any individual with known risks for hepatitis C.  Osteoporosis screening.** / A one-time screening for women ages 7 years and over and women at risk for fractures or osteoporosis.  Skin self-exam. / Monthly.  Influenza vaccine. / Every year.  Tetanus, diphtheria, and acellular pertussis (Tdap/Td) vaccine.** / 1 dose of Td every 10 years.  Varicella vaccine.** / Consult your health care provider.  Zoster vaccine.** / 1 dose for adults aged 5 years or older.  Pneumococcal 13-valent conjugate (PCV13) vaccine.** / Consult your health care provider.  Pneumococcal polysaccharide (PPSV23) vaccine.** / 1 dose for all adults aged 74 years and older.  Meningococcal vaccine.** / Consult your health care provider.  Hepatitis A vaccine.** / Consult your health care provider.  Hepatitis B vaccine.** / Consult your health care provider.  Haemophilus influenzae type b (Hib) vaccine.** / Consult your health care provider. ** Family history and personal history of risk and conditions may change your health care provider's recommendations. Document Released: 01/21/2002 Document Revised: 09/15/2013  [facility] Patient Information 2014 [place], [place].   EXERCISE AND DIET:  We recommended that you start or continue a regular exercise program for good health. Regular exercise means any activity that makes your heart beat faster and makes you sweat.  We recommend exercising at least 30 minutes per day at least 3 days a week, preferably 5.  We also recommend a diet low in fat and sugar / carbohydrates.  Inactivity, poor dietary choices and obesity can cause diabetes, heart attack, stroke, and kidney damage, among others.     ALCOHOL AND SMOKING:  Women should limit their alcohol intake to no  more than 7 drinks/beers/glasses of wine (combined, not each!) per week. Moderation of alcohol intake to this level decreases your risk of breast cancer and liver damage.  ( And of course, no recreational drugs are part of a healthy lifestyle.)  Also, you should not be smoking at all or even being exposed to second hand smoke. Most people know smoking can  cause cancer, and various heart and lung diseases, but did you know it also contributes to weakening of your bones?  Aging of your skin?  Yellowing of your teeth and nails?   CALCIUM AND VITAMIN D:  Adequate intake of calcium and Vitamin D are recommended.  The recommendations for exact amounts of these supplements seem to change often, but generally speaking 600 mg of calcium (either carbonate or citrate) and 800 units of Vitamin D per day seems prudent. Certain women may benefit from higher intake of Vitamin D.  If you are among these women, your doctor will have told you during your visit.     PAP SMEARS:  Pap smears, to check for cervical cancer or precancers,  have traditionally been done yearly, although recent scientific advances have shown that most women can have pap smears less often.  However, every woman still should have a physical exam from her gynecologist or primary care physician every year. It will include a breast check, inspection of the vulva and vagina to check for abnormal growths or skin changes, a visual exam of the cervix, and then an exam to evaluate the size and shape of the uterus and ovaries.  And after 62 years of age, a rectal exam is indicated to check for rectal cancers. We will also provide age appropriate advice regarding health maintenance, like when you should have certain vaccines, screening for sexually transmitted diseases, bone density testing, colonoscopy, mammograms, etc.    MAMMOGRAMS:  All women over 71 years old should have a yearly mammogram. Many facilities now offer a "3D" mammogram, which may cost  around $50 extra out of pocket. If possible,  we recommend you accept the option to have the 3D mammogram performed.  It both reduces the number of women who will be called back for extra views which then turn out to be normal, and it is better than the routine mammogram at detecting truly abnormal areas.     COLONOSCOPY:  Colonoscopy to screen for colon cancer is recommended for all women at age 52.  We know, you hate the idea of the prep.  We agree, BUT, having colon cancer and not knowing it is worse!!  Colon cancer so often starts as a polyp that can be seen and removed at colonscopy, which can quite literally save your life!  And if your first colonoscopy is normal and you have no family history of colon cancer, most women don't have to have it again for 10 years.  Once every ten years, you can do something that may end up saving your life, right?  We will be happy to help you get it scheduled when you are ready.  Be sure to check your insurance coverage so you understand how much it will cost.  It may be covered as a preventative service at no cost, but you should check your particular policy.

## 2017-12-26 NOTE — Progress Notes (Signed)
Impression and Recommendations:    1. Encounter for general adult medical examination with abnormal findings   2. Gastroesophageal reflux disease, esophagitis presence not specified   3. Counseling on health promotion and disease prevention   4. Screening exam for skin cancer     1) Anticipatory Guidance: Discussed importance of wearing a seatbelt while driving, not texting while driving; sunscreen when outside along with yearly skin surveillance; eating a well balanced and modest diet; physical activity at least 25 minutes per day or 150 min/ week of moderate to intense activity.  2) Immunizations / Screenings / Labs:  All immunizations and screenings that patient agrees to, are up-to-date per recommendations or will be updated today.  Patient understands the needs for q 62mo dental and yearly vision screens which pt will schedule independently. Obtain CBC, CMP, HgA1c, Lipid panel, TSH and vit D when fasting if not already done recently.   3) Weight:   Discussed goal of losing even 5-10% of current body weight which would improve overall feelings of well being and improve objective health data significantly.   Improve nutrient density of diet through increasing intake of fruits and vegetables and decreasing saturated/trans fats, white flour products and refined sugar products.   4) Chronic conditions:  Reflux - Prescribed 20 mg of Prilosec. Recommended to take her Prilosec daily to address her reflux concerns. If once a day works well for her, she should continue once a day. Otherwise it is twice daily, after her two largest meals.  IBS/Gastroenterology - Omeprazole may help to calm her IBS symptoms. We will continue to monitor her reflux and IBS.  - Patient will do stool cards.  Gynecology - Patient will make sure she takes her gynecological concerns to there OBGYN during her upcoming appointment in February.  Dermatology - Ambulatory referral to Dermatology. 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 Patient desires derm skin screenings.  Follow up - We will pull her immunizations to make sure she is up to date on her vaccines.  - Patient is fasting. We will draw blood on her today if she is due.  - Recommended making an office visit in the near future to come and discuss any further concerns.   Meds ordered this encounter  Medications  . omeprazole (PRILOSEC) 20 MG capsule    Sig: Take 1 capsule (20 mg total) by mouth daily.    Dispense:  90 capsule    Refill:  1    Orders Placed This Encounter  Procedures  . Comprehensive metabolic panel  . Hemoglobin A1c  . Lipid panel  . TSH  . VITAMIN D 25 Hydroxy (Vit-D Deficiency, Fractures)  . CBC with Differential/Platelet  . Ambulatory referral to Dermatology    Gross side effects, risk and benefits, and alternatives of medications discussed with patient.  Patient is aware that all medications have potential side effects and we are unable to predict every side effect or drug-drug interaction that may occur.  Expresses verbal understanding and consents to current therapy plan and treatment regimen.  F-up preventative CPE in 1 year. F/up sooner for chronic care management as discussed and/or prn.  Please see orders placed and AVS handed out to patient at the end of our visit for further patient instructions/ counseling done pertaining to today's office visit.  This document serves as a record of services personally performed by Mellody Dance, DO. It was created on her behalf by Toni Amend, a trained medical scribe. The creation  of this record is based on the scribe's personal observations and the provider's statements to them.   I have reviewed the above medical documentation for accuracy and completeness and I concur.  Mellody Dance 12/30/17 8:13 AM    Subjective:    Chief Complaint  Patient presents with  . Annual Exam   CC: several concerns  HPI: Kimberly Vang is a  62 y.o. female who presents to [place] [facility] today a yearly health maintenance exam.  Health Maintenance Summary Reviewed and updated, unless pt declines services.  Colonoscopy:    Her last colonoscopy was in 2015, in [place] through [facility] with Dr. Lyndel Safe. Patient notes that she is due for her next one in five years (2020). Tobacco History Reviewed:   Y  CT scan for screening lung CA:  n/a Abdominal Ultrasound:     ( Unnecessary secondary to < 49 or > 54 years old) Alcohol:    No concerns, no excessive use Exercise Habits:   Not meeting AHA guidelines STD concerns:   None. Patient has an upcoming appointment with GYN Everett Graff on February 8th to discuss further concerns, including menopausal concerns. She hasn't been to see the OBGYN in a while. Believes she had a negative pap smear in 2018. Will allow her gynecologist to follow up on these concerns during her upcoming appointment. Drug Use:   None Birth control method:   n/a Menses regular:     n/a Lumps or breast concerns:      No. Due for her mammogram now (last done 01/07/2017). Breast Cancer Family History:      No.  Bone/ DEXA scan:   ( Unnecessary due to < 65 and average risk)   Hearing She doesn't believe she's ever had her hearing checked. She does not have any pressing hearing concerns.  Vision Her last eye exam was a little over a year ago, and knows she needs to schedule a follow-up.  Dental Just had a dental visit yesterday, and confirms going at least every six months. At the dentist, she was told to talk about silent acid reflux with her PCP. She wonders if she can take her OTC Prilosec daily. Her reflux symptoms have been occurring regularly lately, but if she does take her Prilosec, it controls her symptoms. Greasy, fried foods cause her to have reflux; sometimes citrus.  IBS Denies nausea and vomiting, but comments that she's always had IBS, with the last couple of years being  constipation, gas, bloating, and discomfort. During the physical exam, she notes that her stomach is "all tender," but admits that this is likely due to her IBS.  Dermatological Does not currently have a dermatologist. She has had two skin cancers removed, and believes they were squamous cell. Confirms that she does wear sunscreen.  General She changed jobs, and is under a lot less stress. She notes that her old job "was killing her," so she returned to a company she worked with before, and they agreed to pay her twice what they paid her prior. Her work week is now four days.   Immunization History  Administered Date(s) Administered  . Tdap 12/10/2016    Health Maintenance  Topic Date Due  . INFLUENZA VACCINE  08/28/2018 (Originally 07/09/2017)  . Hepatitis C Screening  12/26/2018 (Originally [birth year])  . HIV Screening  12/26/2018 (Originally 04/14/1971)  . PAP SMEAR  07/19/2018  . MAMMOGRAM  01/07/2019  . COLONOSCOPY  11/16/2024  . TETANUS/TDAP  12/10/2026     Wt Readings from Last 3 Encounters:  12/26/17 161 lb 12.8 oz (73.4 kg)  05/12/17 160 lb 12.8 oz (72.9 kg)  02/10/17 168 lb 4.8 oz (76.3 kg)   BP Readings from Last 3 Encounters:  12/26/17 123/84  05/12/17 139/85  02/10/17 (!) 147/90   Pulse Readings from Last 3 Encounters:  12/26/17 78  05/12/17 82  02/10/17 72     Past Medical History:  Diagnosis Date  . Adjustment disorder with mixed anxiety and depressed mood 06/30/2016  . Chronic tension headaches 06/30/2016  . Depression 07/01/2011      Past Surgical History:  Procedure Laterality Date  . BREAST ENHANCEMENT SURGERY  1989  . left wrist surgery  2003  . tonsiectomy  1965      Family History  Problem Relation Age of Onset  . Arthritis Mother   . Coronary artery disease Father   . Transient ischemic attack Father   . Cancer Father   . Stroke Father   . Cancer Sister       Social History   Substance and Sexual Activity  Drug Use No  ,    Social History   Substance and Sexual Activity  Alcohol Use Yes  . Alcohol/week: 6.0 oz  . Types: 10 Glasses of wine per week  ,   Social History   Tobacco Use  Smoking Status Never Smoker  Smokeless Tobacco Never Used  ,   Social History   Substance and Sexual Activity  Sexual Activity Yes  . Birth control/protection: Post-menopausal, None    Current Outpatient Medications on File Prior to Visit  Medication Sig Dispense Refill  . escitalopram (LEXAPRO) 20 MG tablet TAKE ONE TABLET BY MOUTH DAILY 90 tablet 0  . eszopiclone (LUNESTA) 2 MG TABS tablet Take 1 tablet (2 mg total) by mouth at bedtime as needed for sleep. Take immediately before bedtime 30 tablet 1  . fluticasone (FLONASE) 50 MCG/ACT nasal spray Place 2 sprays into both nostrils daily. (Patient not taking: Reported on 12/26/2017) 16 g 6   No current facility-administered medications on file prior to visit.     Allergies: Patient has no known allergies.  Review of Systems: General:   Denies fever, chills, unexplained weight loss.  Optho/Auditory:   Denies visual changes, blurred vision/LOV Respiratory:   Denies SOB, DOE more than baseline levels.  Cardiovascular:   Denies chest pain, palpitations, new onset peripheral edema  Gastrointestinal:   Denies nausea, vomiting, diarrhea.  Genitourinary: Denies dysuria, freq/ urgency, flank pain or discharge from genitals.  Endocrine:     Denies hot or cold intolerance, polyuria, polydipsia. Musculoskeletal:   Denies unexplained myalgias, joint swelling, unexplained arthralgias, gait problems.  Skin:  Denies rash, suspicious lesions Neurological:     Denies dizziness, unexplained weakness, numbness  Psychiatric/Behavioral:   Denies mood changes, suicidal or homicidal ideations, hallucinations   Objective:    Blood pressure 123/84, pulse 78, height 5' 4.25" (1.632 m), weight 161 lb 12.8 oz (73.4 kg), last menstrual period 10/16/2011, SpO2 99 %. Body mass index is  27.56 kg/m. General Appearance:    Alert, cooperative, no distress, appears stated age  Head:    Normocephalic, without obvious abnormality, atraumatic  Eyes:    PERRL, conjunctiva/corneas clear, EOM's intact, fundi    benign, both eyes  Ears:    Normal TM's and external ear canals, both ears  Nose:   Nares normal, septum midline, mucosa normal, no drainage    or sinus tenderness  Throat:   Lips w/o lesion, mucosa moist, and tongue normal; teeth and   gums normal  Neck:   Supple, symmetrical, trachea midline, no adenopathy;    thyroid:  no enlargement/tenderness/nodules; no carotid   bruit or JVD  Back:     Symmetric, no curvature, ROM normal, no CVA tenderness  Lungs:     Clear to auscultation bilaterally, respirations unlabored, no       Wh/ R/ R  Chest Wall:    No tenderness or gross deformity; normal excursion   Heart:    Regular rate and rhythm, S1 and S2 normal, no murmur, rub   or gallop     Abdomen:     Soft, non-tender, bowel sounds active all four quadrants, NO   G/R/R, no masses, no organomegaly         Extremities:   Extremities normal, atraumatic, no cyanosis or gross edema  Pulses:   2+ and symmetric all extremities  Skin:   Warm, dry, Skin color, texture, turgor normal, no obvious rashes or lesions Psych: No HI/SI, judgement and insight good, Euthymic mood. Full Affect.  Neurologic:   CNII-XII intact, normal strength, sensation and reflexes    Throughout

## 2017-12-27 LAB — COMPREHENSIVE METABOLIC PANEL
ALT: 17 IU/L (ref 0–32)
AST: 25 IU/L (ref 0–40)
Albumin/Globulin Ratio: 2 (ref 1.2–2.2)
Albumin: 4.3 g/dL (ref 3.6–4.8)
Alkaline Phosphatase: 86 IU/L (ref 39–117)
BUN/Creatinine Ratio: 11 — ABNORMAL LOW (ref 12–28)
BUN: 10 mg/dL (ref 8–27)
Bilirubin Total: 1.5 mg/dL — ABNORMAL HIGH (ref 0.0–1.2)
CO2: 19 mmol/L — ABNORMAL LOW (ref 20–29)
Calcium: 9.5 mg/dL (ref 8.7–10.3)
Chloride: 105 mmol/L (ref 96–106)
Creatinine, Ser: 0.9 mg/dL (ref 0.57–1.00)
GFR calc Af Amer: 80 mL/min/{1.73_m2} (ref >59)
GFR calc non Af Amer: 69 mL/min/{1.73_m2} (ref >59)
Globulin, Total: 2.2 g/dL (ref 1.5–4.5)
Glucose: 102 mg/dL — ABNORMAL HIGH (ref 65–99)
Potassium: 4.4 mmol/L (ref 3.5–5.2)
Sodium: 141 mmol/L (ref 134–144)
Total Protein: 6.5 g/dL (ref 6.0–8.5)

## 2017-12-27 LAB — CBC WITH DIFFERENTIAL/PLATELET
Basophils Absolute: 0 10*3/uL (ref 0.0–0.2)
Basos: 0 %
EOS (ABSOLUTE): 0.1 10*3/uL (ref 0.0–0.4)
Eos: 2 %
Hematocrit: 47.7 % — ABNORMAL HIGH (ref 34.0–46.6)
Hemoglobin: 16.1 g/dL — ABNORMAL HIGH (ref 11.1–15.9)
Immature Grans (Abs): 0 10*3/uL (ref 0.0–0.1)
Immature Granulocytes: 0 %
Lymphocytes Absolute: 1.5 10*3/uL (ref 0.7–3.1)
Lymphs: 27 %
MCH: 30.7 pg (ref 26.6–33.0)
MCHC: 33.8 g/dL (ref 31.5–35.7)
MCV: 91 fL (ref 79–97)
Monocytes Absolute: 0.4 10*3/uL (ref 0.1–0.9)
Monocytes: 7 %
Neutrophils Absolute: 3.6 10*3/uL (ref 1.4–7.0)
Neutrophils: 64 %
Platelets: 277 10*3/uL (ref 150–379)
RBC: 5.24 x10E6/uL (ref 3.77–5.28)
RDW: 13.6 % (ref 12.3–15.4)
WBC: 5.6 10*3/uL (ref 3.4–10.8)

## 2017-12-27 LAB — LIPID PANEL
Chol/HDL Ratio: 3 ratio (ref 0.0–4.4)
Cholesterol, Total: 211 mg/dL — ABNORMAL HIGH (ref 100–199)
HDL: 71 mg/dL (ref >39)
LDL Calculated: 122 mg/dL — ABNORMAL HIGH (ref 0–99)
Triglycerides: 90 mg/dL (ref 0–149)
VLDL Cholesterol Cal: 18 mg/dL (ref 5–40)

## 2017-12-27 LAB — HEMOGLOBIN A1C
Est. average glucose Bld gHb Est-mCnc: 108 mg/dL
Hgb A1c MFr Bld: 5.4 % (ref 4.8–5.6)

## 2017-12-27 LAB — TSH: TSH: 1.92 u[IU]/mL (ref 0.450–4.500)

## 2017-12-27 LAB — VITAMIN D 25 HYDROXY (VIT D DEFICIENCY, FRACTURES): Vit D, 25-Hydroxy: 31.3 ng/mL (ref 30.0–100.0)

## 2017-12-30 MED FILL — LOTEMAX 0.5% GEL: 0.5 | 28 days supply | Qty: 10 | Fill #0

## 2018-01-02 ENCOUNTER — Telehealth: Payer: Self-pay | Admitting: Family Medicine

## 2018-01-02 NOTE — Telephone Encounter (Signed)
Patient called 1/25 left msg requesting call w/Lab results--forwarding message to medical assistant.  ---glh

## 2018-01-05 NOTE — Telephone Encounter (Signed)
Called left message for patient to call back. MPulliam, CMA/RT(R)

## 2018-01-06 NOTE — Telephone Encounter (Signed)
Spoke to patient about lab results. Please see result note. MPulliam, CMA/RT(R)

## 2018-01-13 ENCOUNTER — Ambulatory Visit (INDEPENDENT_AMBULATORY_CARE_PROVIDER_SITE_OTHER): Payer: 59 | Admitting: Adult Health

## 2018-01-13 ENCOUNTER — Encounter: Payer: Self-pay | Admitting: Adult Health

## 2018-01-13 ENCOUNTER — Encounter: Payer: Self-pay | Admitting: Family Medicine

## 2018-01-13 VITALS — Ht 64.25 in | Wt 165.0 lb

## 2018-01-13 DIAGNOSIS — H6690 Otitis media, unspecified, unspecified ear: Secondary | ICD-10-CM | POA: Diagnosis not present

## 2018-01-13 DIAGNOSIS — J0120 Acute ethmoidal sinusitis, unspecified: Secondary | ICD-10-CM | POA: Diagnosis not present

## 2018-01-13 MED ORDER — AMOXICILLIN-POT CLAVULANATE 875-125 MG PO TABS: 1.0000 | ORAL_TABLET | Freq: Two times a day (BID) | ORAL | 0 refills | Status: DC

## 2018-01-13 MED ORDER — ONDANSETRON HCL 4 MG PO TABS: 4.0000 mg | ORAL_TABLET | Freq: Three times a day (TID) | ORAL | 0 refills | Status: DC | PRN

## 2018-01-13 NOTE — Progress Notes (Signed)
Subjective:    Patient ID: Kimberly Vang, female    DOB: 1956-05-04, 62 y.o.   MRN: [id number]  HPI:  Kimberly Vang presents with L sided facial pressure, clear/thick nasal drainage, HA (dull, constant 3/10), intermittent dizziness with position changes- all started > week ago and sx's worsening.  She reports nausea without vomiting since this am. She reports constipation and hx of IBS- she has appt next week with her PCP to address this issue. She denies CP/dyspnea/palpitations/cough.   She denies abdomina pain or blood in urine/stool. She denies hx of vertigo  Patient Care Team    Relationship Specialty Notifications Start End  Mellody Dance, DO PCP - General Family Medicine  11/08/16   Jackquline Denmark, MD Consulting Physician Internal Medicine  12/26/17    Comment: colonoscopy  Everett Graff, MD Consulting Physician Obstetrics and Gynecology  12/26/17     Patient Active Problem List   Diagnosis Date Noted  . GERD (gastroesophageal reflux disease) 12/26/2017  . Acute non-recurrent ethmoidal sinusitis 01/06/2017  . Acute nonintractable headache 01/06/2017  . Overweight (BMI 25.0-29.9) 07/13/2016  . Sleeping difficulties- due to stress 07/13/2016  . Myalgia and myositis 07/10/2016  . Chronic tension headaches 06/30/2016  . Adjustment disorder with mixed anxiety and depressed mood 06/30/2016  . Chest pain 01/12/2016  . Abnormal stress test 01/12/2016     Past Medical History:  Diagnosis Date  . Adjustment disorder with mixed anxiety and depressed mood 06/30/2016  . Chronic tension headaches 06/30/2016  . Depression 07/01/2011     Past Surgical History:  Procedure Laterality Date  . BREAST ENHANCEMENT SURGERY  1989  . left wrist surgery  2003  . tonsiectomy  1965     Family History  Problem Relation Age of Onset  . Arthritis Mother   . Coronary artery disease Father   . Transient ischemic attack Father   . Cancer Father   . Stroke Father   . Cancer Sister      Social  History   Substance and Sexual Activity  Drug Use No     Social History   Substance and Sexual Activity  Alcohol Use Yes  . Alcohol/week: 6.0 oz  . Types: 10 Glasses of wine per week     Social History   Tobacco Use  Smoking Status Never Smoker  Smokeless Tobacco Never Used     Outpatient Encounter Medications as of 01/13/2018  Medication Sig  . escitalopram (LEXAPRO) 20 MG tablet TAKE ONE TABLET BY MOUTH DAILY  . omeprazole (PRILOSEC) 20 MG capsule Take 1 capsule (20 mg total) by mouth daily.  Marland Kitchen LOTEMAX 0.5 % GEL Place 1 application into both eyes 2 (two) times daily.  . [DISCONTINUED] eszopiclone (LUNESTA) 2 MG TABS tablet Take 1 tablet (2 mg total) by mouth at bedtime as needed for sleep. Take immediately before bedtime  . [DISCONTINUED] fluticasone (FLONASE) 50 MCG/ACT nasal spray Place 2 sprays into both nostrils daily. (Patient not taking: Reported on 12/26/2017)   No facility-administered encounter medications on file as of 01/13/2018.     Allergies: Patient has no known allergies.  Body mass index is 28.1 kg/m.  Height 5' 4.25" (1.632 m), weight 165 lb (74.8 kg), last menstrual period 10/16/2011, SpO2 95 %.     Review of Systems  Constitutional: Positive for activity change, appetite change, diaphoresis and fatigue. Negative for chills, fever and unexpected weight change.  HENT: Positive for congestion, facial swelling, postnasal drip, rhinorrhea, sinus pressure and sinus pain. Negative for  sore throat, trouble swallowing and voice change.   Eyes: Negative for visual disturbance.  Respiratory: Negative for cough, chest tightness, shortness of breath, wheezing and stridor.   Cardiovascular: Negative for chest pain, palpitations and leg swelling.  Gastrointestinal: Positive for abdominal pain and constipation. Negative for abdominal distention, blood in stool, diarrhea, nausea and vomiting.  Genitourinary: Negative for difficulty urinating and flank pain.   Neurological: Positive for dizziness and headaches. Negative for tremors.  Hematological: Does not bruise/bleed easily.       Objective:   Physical Exam  Constitutional: She is oriented to person, place, and time. She appears well-developed and well-nourished. She has a sickly appearance.  HENT:  Head: Normocephalic and atraumatic.  Right Ear: Hearing, external ear and ear canal normal. Tympanic membrane is erythematous and bulging. No decreased hearing is noted.  Left Ear: Hearing, external ear and ear canal normal. Tympanic membrane is bulging. Tympanic membrane is not erythematous. No decreased hearing is noted.  Nose: Mucosal edema and rhinorrhea present. Right sinus exhibits maxillary sinus tenderness. Right sinus exhibits no frontal sinus tenderness. Left sinus exhibits maxillary sinus tenderness and frontal sinus tenderness.  Mouth/Throat: Uvula is midline, oropharynx is clear and moist and mucous membranes are normal.  Eyes: Conjunctivae are normal. Pupils are equal, round, and reactive to light.  Neck: Normal range of motion. Neck supple.  Cardiovascular: Normal rate, regular rhythm, normal heart sounds and intact distal pulses.  No murmur heard. Pulmonary/Chest: Effort normal and breath sounds normal. No respiratory distress. She has no wheezes. She has no rales. She exhibits no tenderness.  Abdominal: Soft. Bowel sounds are normal. She exhibits no distension and no mass. There is generalized tenderness. There is no rebound, no guarding, no CVA tenderness, no tenderness at McBurney's point and negative Murphy's sign.  Mild tenderness with palpation  Lymphadenopathy:    She has no cervical adenopathy.  Neurological: She is alert and oriented to person, place, and time.  Dix-Hallpike Test Neg  Skin: Skin is warm. No rash noted. She is not diaphoretic. No erythema. No pallor.  Psychiatric: She has a normal mood and affect. Her behavior is normal. Judgment and thought content  normal.  Nursing note and vitals reviewed.         Assessment & Plan:   1. Acute non-recurrent ethmoidal sinusitis   2. Acute otitis media, unspecified otitis media type     Acute non-recurrent ethmoidal sinusitis Increase fluids/rest/vit c-2,000mg /daily Alternate OTC Acetaminophen and Ibuprofen for fever/body aches. Please take Augmentin twice daily for 10 days for sinusitis/otitis media. Please use Zofran as needed for nausea/vomiting. If symptoms persist after antibiotic, please call clinic. Work excuse provided- okay to return 01/15/18.  Acute otitis media Increase fluids/rest/vit c-2,000mg /daily Alternate OTC Acetaminophen and Ibuprofen for fever/body aches. Please take Augmentin twice daily for 10 days for sinusitis/otitis media. Please use Zofran as needed for nausea/vomiting. If symptoms persist after antibiotic, please call clinic. Work excuse provided- okay to return 01/15/18.    FOLLOW-UP:  Return if symptoms worsen or fail to improve.

## 2018-01-13 NOTE — Patient Instructions (Addendum)
Sinusitis, Adult Sinusitis is soreness and inflammation of your sinuses. Sinuses are hollow spaces in the bones around your face. Your sinuses are located:  Around your eyes.  In the middle of your forehead.  Behind your nose.  In your cheekbones.  Your sinuses and nasal passages are lined with a stringy fluid (mucus). Mucus normally drains out of your sinuses. When your nasal tissues become inflamed or swollen, the mucus can become trapped or blocked so air cannot flow through your sinuses. This allows bacteria, viruses, and funguses to grow, which leads to infection. Sinusitis can develop quickly and last for 7?10 days (acute) or for more than 12 weeks (chronic). Sinusitis often develops after a cold. What are the causes? This condition is caused by anything that creates swelling in the sinuses or stops mucus from draining, including:  Allergies.  Asthma.  Bacterial or viral infection.  Abnormally shaped bones between the nasal passages.  Nasal growths that contain mucus (nasal polyps).  Narrow sinus openings.  Pollutants, such as chemicals or irritants in the air.  A foreign object stuck in the nose.  A fungal infection. This is rare.  What increases the risk? The following factors may make you more likely to develop this condition:  Having allergies or asthma.  Having had a recent cold or respiratory tract infection.  Having structural deformities or blockages in your nose or sinuses.  Having a weak immune system.  Doing a lot of swimming or diving.  Overusing nasal sprays.  Smoking.  What are the signs or symptoms? The main symptoms of this condition are pain and a feeling of pressure around the affected sinuses. Other symptoms include:  Upper toothache.  Earache.  Headache.  Bad breath.  Decreased sense of smell and taste.  A cough that may get worse at night.  Fatigue.  Fever.  Thick drainage from your nose. The drainage is often green and  it may contain pus (purulent).  Stuffy nose or congestion.  Postnasal drip. This is when extra mucus collects in the throat or back of the nose.  Swelling and warmth over the affected sinuses.  Sore throat.  Sensitivity to light.  How is this diagnosed? This condition is diagnosed based on symptoms, a medical history, and a physical exam. To find out if your condition is acute or chronic, your health care provider may:  Look in your nose for signs of nasal polyps.  Tap over the affected sinus to check for signs of infection.  View the inside of your sinuses using an imaging device that has a light attached (endoscope).  If your health care provider suspects that you have chronic sinusitis, you may also:  Be tested for allergies.  Have a sample of mucus taken from your nose (nasal culture) and checked for bacteria.  Have a mucus sample examined to see if your sinusitis is related to an allergy.  If your sinusitis does not respond to treatment and it lasts longer than 8 weeks, you may have an MRI or CT scan to check your sinuses. These scans also help to determine how severe your infection is. In rare cases, a bone biopsy may be done to rule out more serious types of fungal sinus disease. How is this treated? Treatment for sinusitis depends on the cause and whether your condition is chronic or acute. If a virus is causing your sinusitis, your symptoms will go away on their own within 10 days. You may be given medicines to relieve your symptoms,   including:  Topical nasal decongestants. They shrink swollen nasal passages and let mucus drain from your sinuses.  Antihistamines. These drugs block inflammation that is triggered by allergies. This can help to ease swelling in your nose and sinuses.  Topical nasal corticosteroids. These are nasal sprays that ease inflammation and swelling in your nose and sinuses.  Nasal saline washes. These rinses can help to get rid of thick mucus in  your nose.  If your condition is caused by bacteria, you will be given an antibiotic medicine. If your condition is caused by a fungus, you will be given an antifungal medicine. Surgery may be needed to correct underlying conditions, such as narrow nasal passages. Surgery may also be needed to remove polyps. Follow these instructions at home: Medicines  Take, use, or apply over-the-counter and prescription medicines only as told by your health care provider. These may include nasal sprays.  If you were prescribed an antibiotic medicine, take it as told by your health care provider. Do not stop taking the antibiotic even if you start to feel better. Hydrate and Humidify  Drink enough water to keep your urine clear or pale yellow. Staying hydrated will help to thin your mucus.  Use a cool mist humidifier to keep the humidity level in your home above 50%.  Inhale steam for 10-15 minutes, 3-4 times a day or as told by your health care provider. You can do this in the bathroom while a hot shower is running.  Limit your exposure to cool or dry air. Rest  Rest as much as possible.  Sleep with your head raised (elevated).  Make sure to get enough sleep each night. General instructions  Apply a warm, moist washcloth to your face 3-4 times a day or as told by your health care provider. This will help with discomfort.  Wash your hands often with soap and water to reduce your exposure to viruses and other germs. If soap and water are not available, use hand sanitizer.  Do not smoke. Avoid being around people who are smoking (secondhand smoke).  Keep all follow-up visits as told by your health care provider. This is important. Contact a health care provider if:  You have a fever.  Your symptoms get worse.  Your symptoms do not improve within 10 days. Get help right away if:  You have a severe headache.  You have persistent vomiting.  You have pain or swelling around your face or  eyes.  You have vision problems.  You develop confusion.  Your neck is stiff.  You have trouble breathing. This information is not intended to replace advice given to you by your health care provider. Make sure you discuss any questions you have with your health care provider. Document Released: 11/25/2005 Document Revised: 07/21/2016 Document Reviewed: 09/20/2015 Elsevier Interactive Patient Education  2018 [employer].   Otitis Media, Adult Otitis media occurs when there is inflammation and fluid in the middle ear. Your middle ear is a part of the ear that contains bones for hearing as well as air that helps send sounds to your brain. What are the causes? This condition is caused by a blockage in the eustachian tube. This tube drains fluid from the ear to the back of the nose (nasopharynx). A blockage in this tube can be caused by an object or by swelling (edema) in the tube. Problems that can cause a blockage include:  A cold or other upper respiratory infection.  Allergies.  An irritant, such as  tobacco smoke.  Enlarged adenoids. The adenoids are areas of soft tissue located high in the back of the throat, behind the nose and the roof of the mouth.  A mass in the nasopharynx.  Damage to the ear caused by pressure changes (barotrauma).  What are the signs or symptoms? Symptoms of this condition include:  Ear pain.  A fever.  Decreased hearing.  A headache.  Tiredness (lethargy).  Fluid leaking from the ear.  Ringing in the ear.  How is this diagnosed? This condition is diagnosed with a physical exam. During the exam your health care provider will use an instrument called an otoscope to look into your ear and check for redness, swelling, and fluid. He or she will also ask about your symptoms. Your health care provider may also order tests, such as:  A test to check the movement of the eardrum (pneumatic otoscopy). This test is done by squeezing a small amount  of air into the ear.  A test that changes air pressure in the middle ear to check how well the eardrum moves and whether the eustachian tube is working (tympanogram).  How is this treated? This condition usually goes away on its own within 3-5 days. But if the condition is caused by a bacteria infection and does not go away own its own, or keeps coming back, your health care provider may:  Prescribe antibiotic medicines to treat the infection.  Prescribe or recommend medicines to control pain.  Follow these instructions at home:  Take over-the-counter and prescription medicines only as told by your health care provider.  If you were prescribed an antibiotic medicine, take it as told by your health care provider. Do not stop taking the antibiotic even if you start to feel better.  Keep all follow-up visits as told by your health care provider. This is important. Contact a health care provider if:  You have bleeding from your nose.  There is a lump on your neck.  You are not getting better in 5 days.  You feel worse instead of better. Get help right away if:  You have severe pain that is not controlled with medicine.  You have swelling, redness, or pain around your ear.  You have stiffness in your neck.  A part of your face is paralyzed.  The bone behind your ear (mastoid) is tender when you touch it.  You develop a severe headache. Summary  Otitis media is redness, soreness, and swelling of the middle ear.  This condition usually goes away on its own within 3-5 days.  If the problem does not go away in 3-5 days, your health care provider may prescribe or recommend medicines to treat your symptoms.  If you were prescribed an antibiotic medicine, take it as told by your health care provider. This information is not intended to replace advice given to you by your health care provider. Make sure you discuss any questions you have with your health care provider. Document  Released: 08/30/2004 Document Revised: 11/15/2016 Document Reviewed: 11/15/2016 Elsevier Interactive Patient Education  2018 [place].  Increase fluids/rest/vit c-2,000mg /daily Alternate OTC Acetaminophen and Ibuprofen for fever/body aches. Please take Augmentin twice daily for 10 days for sinusitis/otitis media. Please use Zofran as needed for nausea/vomiting. If symptoms persist after antibiotic, please call clinic. Work excuse provided- okay to return 01/15/18. FEEL BETTER!

## 2018-01-13 NOTE — Assessment & Plan Note (Signed)
Increase fluids/rest/vit c-2,000mg /daily Alternate OTC Acetaminophen and Ibuprofen for fever/body aches. Please take Augmentin twice daily for 10 days for sinusitis/otitis media. Please use Zofran as needed for nausea/vomiting. If symptoms persist after antibiotic, please call clinic. Work excuse provided- okay to return 01/15/18.

## 2018-01-16 DIAGNOSIS — Z6828 Body mass index (BMI) 28.0-28.9, adult: Secondary | ICD-10-CM | POA: Diagnosis not present

## 2018-01-16 DIAGNOSIS — Z01419 Encounter for gynecological examination (general) (routine) without abnormal findings: Secondary | ICD-10-CM | POA: Diagnosis not present

## 2018-01-16 DIAGNOSIS — Z1231 Encounter for screening mammogram for malignant neoplasm of breast: Secondary | ICD-10-CM | POA: Diagnosis not present

## 2018-01-16 DIAGNOSIS — Z124 Encounter for screening for malignant neoplasm of cervix: Secondary | ICD-10-CM | POA: Diagnosis not present

## 2018-01-21 DIAGNOSIS — J322 Chronic ethmoidal sinusitis: Secondary | ICD-10-CM | POA: Diagnosis not present

## 2018-01-21 DIAGNOSIS — J320 Chronic maxillary sinusitis: Secondary | ICD-10-CM | POA: Diagnosis not present

## 2018-01-21 DIAGNOSIS — J301 Allergic rhinitis due to pollen: Secondary | ICD-10-CM | POA: Diagnosis not present

## 2018-01-21 DIAGNOSIS — J040 Acute laryngitis: Secondary | ICD-10-CM | POA: Diagnosis not present

## 2018-01-21 DIAGNOSIS — J342 Deviated nasal septum: Secondary | ICD-10-CM | POA: Diagnosis not present

## 2018-01-23 ENCOUNTER — Ambulatory Visit: Payer: 59

## 2018-01-23 ENCOUNTER — Encounter: Payer: Self-pay | Admitting: Family Medicine

## 2018-01-23 ENCOUNTER — Ambulatory Visit (INDEPENDENT_AMBULATORY_CARE_PROVIDER_SITE_OTHER): Payer: 59 | Admitting: Family Medicine

## 2018-01-23 VITALS — BP 126/74 | HR 70 | Ht 64.25 in | Wt 163.0 lb

## 2018-01-23 DIAGNOSIS — Z8719 Personal history of other diseases of the digestive system: Secondary | ICD-10-CM | POA: Diagnosis not present

## 2018-01-23 DIAGNOSIS — H8112 Benign paroxysmal vertigo, left ear: Secondary | ICD-10-CM | POA: Diagnosis not present

## 2018-01-23 DIAGNOSIS — F4323 Adjustment disorder with mixed anxiety and depressed mood: Secondary | ICD-10-CM

## 2018-01-23 DIAGNOSIS — G44229 Chronic tension-type headache, not intractable: Secondary | ICD-10-CM | POA: Diagnosis not present

## 2018-01-23 DIAGNOSIS — R519 Headache, unspecified: Secondary | ICD-10-CM

## 2018-01-23 DIAGNOSIS — G479 Sleep disorder, unspecified: Secondary | ICD-10-CM | POA: Diagnosis not present

## 2018-01-23 DIAGNOSIS — H6982 Other specified disorders of Eustachian tube, left ear: Secondary | ICD-10-CM | POA: Insufficient documentation

## 2018-01-23 DIAGNOSIS — J320 Chronic maxillary sinusitis: Secondary | ICD-10-CM | POA: Insufficient documentation

## 2018-01-23 DIAGNOSIS — E663 Overweight: Secondary | ICD-10-CM

## 2018-01-23 DIAGNOSIS — R51 Headache: Secondary | ICD-10-CM

## 2018-01-23 MED ORDER — PREDNISONE 20 MG PO TABS: ORAL_TABLET | ORAL | 0 refills | Status: DC

## 2018-01-23 NOTE — Patient Instructions (Addendum)
--->   ween lexapro -take 1/2 tab daily for 2 wks, then 1/2 tab every other day for 1 wk, then 1/2 tab every 3 days for 10d.   - f/up w GI re: IBS sx  - f/up with ENT re: chronic sinusitis sx.     Behavioral Health/ Counseling Referrals    Dr. Tomi Bamberger, PHD Dr. Tomi Bamberger, PHD is a counselor in [place], [place].  [address] [place], [place] [address] [place] [phone]   Kristie Cowman, [place]  [phone] [email or web address] ( she does [place] and faith-based coaching and counseling )   [employer]- ( faith-based counseling ) Address: [place]. [place], [place] [address] [phone] [address] for appointments [phone] Fax Hours: Monday - Thursday 8:00am-6:00pm Closed for lunch 12-1Thursday only Friday: Closed all day   [facility] Nunzio Cobbs, LCSW, ACSW, M.ED.  -Nunzio Cobbs is a licensed clinical social worker in practice over 35 years and with Dr. Chucky May for the last 10 years.  -She sees adults, adolescents, children & families and couples. -Services are provided for mood and anxiety disorders, marital issues, family or parent/child problems, parenting, co-dependency, gender issues, trauma, grief, and stages of life issues. She also provides critical incident stress debriefing.  -Pamala Hurry accepts many employee assistance programs (EAP), [employer], Pharmacist, hospital.  PHONE  [phone]                FAX [phone]   Rodena [email or web address] [employer]- gen counseling;  PHD   Wilber Oliphant, MSW [address]   [facility] Apolonio Schneiders, PhD [address], [place] [phone]   [place] [facility]- children [address], [place], [place] [phone]   Lanelle Bal  Professional Counselor Counseling and [employer] [place] abuse [facility] Manager [address] [place] [phone] [place] [address] [place] Delmer Islam, PhD Oneida Arenas, PhD Leitha Bleak, LCSW Jillene Bucks, PhD-child, adolescent and adults   [facility] [address], [place] [phone] Merilyn Baba, MS-child, adolescent and adults Lennart Pall, PhD-adolescent and adults   [facility]-grief, terminal illness [place], [place] [address], [place] [facility] [address] [place] [phone]   [facility] [place], [place]   [facility] [address], [place], [place] [phone]   [facility] of the [facility] [address] [place] [phone]   [facility] [address], [place] [phone]   [facility] [address] [place] [address], [place] [phone]    How to Treat Vertigo at Home with Exercises  What is Vertigo?  Vertigo is a relatively common symptom most often associated with conditions such as sinusitis (inflammation of your sinuses due to viruses, allergies, or bacterial infections), or an inner ear infection or ear trauma.   It can be brought on by trauma (e.g. a blow to the head or whiplash) or more serious things like minor strokes.   Symptoms can also be brought on by normal degenerative changes to your inner ear that occur with aging.  The condition tends to be more commonly seen in the elderly but it can occur in all ages.    Patients  most often complain of dizziness, as if the room is spinning  around them.   Symptoms are provoked by quick head movements or changes in position like going from standing to lying in bed, or even turning over in bed.   It may present with nausea and/or vomiting, and can be very debilitating to some folks.    By far the most common cause, known as Benign Paroxysmal Positional Vertigo (BPPV), is categorized by a sudden onset of symptoms, that are intense but short-lived (60 seconds or less), which is triggered by a change in head position.   Symptoms usually dissipate if you stay in one position and do not move your head.   Within the inner ear are collections of calcium carbonate crystals referred to as "otoliths" which may become dislodged from their normal position and migrate into the semicircular canals of the inner ear, throwing off your body's ability to sense where you are in space.     Fig. 921 Anatomy of the Right Osseous Labyrinth. Antonieta Iba. Anatomy of the Human Body. 1918.          This maneuver should be performed three times a day.    Repeat this daily until you are free from positional vertigo symptoms for 48 hours.    You can also try this motion at home as well- Self-Treatment of Benign Paroxysmal Positional Vertigo Benign Paroxysmal Positioning Vertigo is caused by loose inner ear crystals in the inner ear that migrate while sleeping to the back-bottom inner ear balance canal, the so-called "posterior semi-circular canal." The maneuver demonstrated below is the way to reposition the loose crystals so that the symptoms caused by the loose crystals go away. You may have a floating, swaying sense while walking or sitting for a few days after this procedure.

## 2018-01-23 NOTE — Progress Notes (Signed)
Impression and Recommendations:    1. Chronic left maxillary sinusitis   2. ETD (Eustachian tube dysfunction), left   3. BPPV (benign paroxysmal positional vertigo), left   4. Sinus headache   5. Sleeping difficulties- due to stress   6. Adjustment disorder with mixed anxiety and depressed mood   7. Overweight (BMI 25.0-29.9)   8. Chronic tension-type headache, not intractable   9. History of IBS     1. Chronic left maxillary sinusitis- Finish Zpack as prescribed by ENT Dr. Ernesto Rutherford. Start short dose meds today as listed below. Keep your future appointment with ENT.Told pt to ask ENT about if CT sinuses will be helpful or not.  2. ETD- steroids as prescribed, OTC flonase and/or nasicort but must drink plenty of liquids and since this dries her out, use humidifer at bedside. If causes nasal bleeding, stop meds.  3. BPPV- handouts and information provided. Do exercises 3 times a day, repeat until symptom-free for 48 hours.  4. Sinus headache-  Told pt she has had chronic tension HA's from stress but this appears to be more due to her sinuses. If sx continue despite clearing of her sinuses, will recommended further eval treatment at that time. Consider neuro for chronic HA.   5. Sleep difficulties- due to stress: will monitor closely. Pt is off meds and declines starting meds.  6. Mood- dose change tape of lexapro: take 0.5 tablet daily for 2 weeks, then 0.5 tablet every other day for 1 week, then 0.5 tablet every 3 days for 10 days. Recommended getting a life Engineer, maintenance.   7. Overweight BMI - continue losing weight.  8. Chronic tension-type HA- per pt this feels different from her typical chronic tension HA and much more localized over her sinuses. Would recommend she deal with the sinus problem first before sent to neurology as per recommended by ENT for migraine workup.   9. History of IBS- This is new information to me today. Symptoms appear to be very much so  emotionally mediated and since she has recent flare of multitude of symptoms, she desires follow up with GI.  -Follow up with ENT regarding chronic sinusitis symptoms.  Follow up in 3 months for chronic care and health management.   Education and routine counseling performed. Handouts provided.  No orders of the defined types were placed in this encounter.   Meds ordered this encounter  Medications  . predniSONE (DELTASONE) 20 MG tablet    Sig: Take 3 pills a day for 2 days, 2 pills a day for 2 days, 1 pill a day for 2 days then one half pill a day for 2 days then off    Dispense:  14 tablet    Refill:  0    Gross side effects, risk and benefits, and alternatives of medications and treatment plan in general discussed with patient.  Patient is aware that all medications have potential side effects and we are unable to predict every side effect or drug-drug interaction that may occur.   Patient will call with any questions prior to using medication if they have concerns.  Expresses verbal understanding and consents to current therapy and treatment regimen.  No barriers to understanding were identified.  Red flag symptoms and signs discussed in detail.  Patient expressed understanding regarding what to do in case of emergency\urgent symptoms  Please see AVS handed out to patient at the end of our visit for further patient instructions/ counseling done pertaining  to today's office visit.   Return for 3 mo- Mood .    Pt was in the office today for 60+ minutes, with over 50% time spent in face to face counseling of patients various medical conditions, treatment plans of those medical conditions including medicine management and lifestyle modification, strategies to improve health and well being; and in coordination of care. SEE ABOVE FOR DETAILS  Note: This document was prepared using Dragon voice recognition software and may include unintentional dictation errors. This document serves as a  record of services personally performed by Mellody Dance, DO. It was created on her behalf by Mayer Masker, a trained medical scribe. The creation of this record is based on the scribe's personal observations and the provider's statements to them.   I have reviewed the above medical documentation for accuracy and completeness and I concur.  Mellody Dance 02/14/18 5:18 PM   --------------------------------------------------------------------------------------------------------------------------------------------------------------------------------------------------------------------------------------------    Subjective:    CC:  Chief Complaint  Patient presents with  . Follow-up    anti-depressant medication    HPI: Kimberly Vang is a 62 y.o. female who presents to [place] at [facility] today for follow-up of mood, HA, dizziness, and GI problems.   Mood: From last chronic OV on 07-10-16, she was on lexapro. Since then, she has seen Mina Marble, NP in office and is not taking anything else for mood. She changed jobs and is now working for a [place] [employer]. She started doing this in August, working 4 days a week. She states it is more low key and more organized. She is happy and not feeling depressed right now. She would like to try to taper off these in the next few months as she now has a better job. She also states she has difficulty with her focusing and she makes mistakes at work as a result. She has been off antidepressants before (and feels like she may need them eventually again), but she wants to try this again to see if her other physical problems (including GI) resolve. She does not have a Social worker. She is feeling fatigued recently.  HA/dizziness: She now has frontal HA that radiates up her head. She saw Mina Marble, NP in office 10 days ago and was diagnosed with HA, sinusitis, vertigo, and Eustachian tube dysfunction. She was prescribed abx and zofran  with relief. She was referred to ENT with Dr. Ernesto Rutherford, and had work up. She was on day 9 of augmentin with him and it was not working, so she started [employer]. She is on day 3 of that. She is following up with him next month. She did not have a CT of her sinuses. She has HA's often. Dr. Ernesto Rutherford suspects this may be a migraine. Dr. Ernesto Rutherford said to not use a daily nasal steroid.  Any time she moves her head, she feels the room is spinning and also wavy. She has a h/o deviated septum.  GI: Dr. Lyndel Safe, GI, but she wants a new provider. She has a h/o IBS and diarrhea. She also has constipation and bloating. This is affecting her daily life and reports not wanting to have sex or going to the gym because of her symptoms. Her last colonoscopy was 2015 and she is UTD at this time.   Women's health She finished menopause at age 49. She states she has been having painful sex recently and has tried a new lubricant. She has not tried any hormone therapy.  Depression screen [facility] 2/9 01/23/2018  01/13/2018 12/26/2017  Decreased Interest 1 1 1   Down, Depressed, Hopeless 1 1 1   PHQ - 2 Score 2 2 2   Altered sleeping 2 2 1   Tired, decreased energy 3 2 1   Change in appetite 0 1 0  Feeling bad or failure about yourself  0 0 0  Trouble concentrating 2 1 0  Moving slowly or fidgety/restless 0 0 0  Suicidal thoughts 0 0 0  PHQ-9 Score 9 8 4   Difficult doing work/chores - Not difficult at all Not difficult at all     GAD 7 : Generalized Anxiety Score 05/12/2017 02/10/2017  Nervous, Anxious, on Edge 3 3  Control/stop worrying 3 3  Worry too much - different things 0 3  Trouble relaxing 3 2  Restless 0 0  Easily annoyed or irritable 1 1  Afraid - awful might happen 1 1  Total GAD 7 Score 11 13  Anxiety Difficulty Very difficult Somewhat difficult     Wt Readings from Last 3 Encounters:  01/23/18 163 lb (73.9 kg)  01/13/18 165 lb (74.8 kg)  12/26/17 161 lb 12.8 oz (73.4 kg)   BP Readings from Last 3 Encounters:    01/23/18 126/74  12/26/17 123/84  05/12/17 139/85   Pulse Readings from Last 3 Encounters:  01/23/18 70  12/26/17 78  05/12/17 82   BMI Readings from Last 3 Encounters:  01/23/18 27.76 kg/m  01/13/18 28.10 kg/m  12/26/17 27.56 kg/m         Patient Care Team    Relationship Specialty Notifications Start End  Mellody Dance, DO PCP - General Family Medicine  11/08/16   Jackquline Denmark, MD Consulting Physician Internal Medicine  12/26/17    Comment: colonoscopy;  IBS  Everett Graff, MD Consulting Physician Obstetrics and Gynecology  12/26/17   Thornell Sartorius, MD Consulting Physician Otolaryngology  01/23/18    Comment: ENT/  seen for chronic sinusitis     Patient Active Problem List   Diagnosis Date Noted  . Adjustment disorder with mixed anxiety and depressed mood 06/30/2016    Priority: High  . Overweight (BMI 25.0-29.9) 07/13/2016    Priority: Medium  . Sleeping difficulties- due to stress 07/13/2016    Priority: Medium  . Myalgia and myositis 07/10/2016    Priority: Medium  . Chronic tension headaches 06/30/2016    Priority: Medium  . History of IBS 01/23/2018  . Chronic left maxillary sinusitis 01/23/2018  . ETD (Eustachian tube dysfunction), left 01/23/2018  . BPPV (benign paroxysmal positional vertigo), left 01/23/2018  . Acute otitis media 01/13/2018  . GERD (gastroesophageal reflux disease) 12/26/2017  . Acute non-recurrent ethmoidal sinusitis 01/06/2017  . Acute nonintractable headache 01/06/2017  . Chest pain 01/12/2016  . Abnormal stress test 01/12/2016    Past Medical history, Surgical history, Family history, Social history, Allergies and Medications have been entered into the medical record, reviewed and changed as needed.    Current Meds  Medication Sig  . escitalopram (LEXAPRO) 20 MG tablet TAKE ONE TABLET BY MOUTH DAILY  . LOTEMAX 0.5 % GEL Place 1 application into both eyes 2 (two) times daily.  Marland Kitchen omeprazole (PRILOSEC) 20 MG capsule  Take 1 capsule (20 mg total) by mouth daily.  . ondansetron (ZOFRAN) 4 MG tablet Take 1 tablet (4 mg total) by mouth every 8 (eight) hours as needed for nausea or vomiting.    Allergies:  No Known Allergies   Review of Systems: Review of Systems: General:   No F/C, wt  loss Pulm:   No DIB, SOB, pleuritic chest pain Card:  No CP, palpitations Abd:  No n/v/d or pain Ext:  No inc edema from baseline Psych: no SI/ HI    Objective:   Blood pressure 126/74, pulse 70, height 5' 4.25" (1.632 m), weight 163 lb (73.9 kg), last menstrual period 10/16/2011, SpO2 99 %. Body mass index is 27.76 kg/m. General:  Well Developed, well nourished, appropriate for stated age.  Neuro:  Alert and oriented,  extra-ocular muscles intact  HEENT:  Normocephalic, atraumatic, neck supple, no carotid bruits appreciated.  L TM retracted. R TM WNL EAC's WNL bilaterally. No lymphadenopathy, + TTP over L frontal, especially L maxillary sinuses, but is TTP globally over sinuses. OP: clear- WNL. Nares: edematous and clear discharge bilaterally. Abd: no organomegaly, no G R R, + bowel sounds x4. No pulsatile mass. No tenderness to deep palpation R upper or L upper quadrants.  Skin:  no gross rash, warm, pink. Cardiac:  RRR, S1 S2 Respiratory:  ECTA B/L and A/P, Not using accessory muscles, speaking in full sentences- unlabored. Vascular:  Ext warm, no cyanosis apprec.; cap RF less 2 sec. Psych:  No HI/SI, judgement and insight good, Full Affect. Mood is guarded, anxious-appearing.

## 2018-01-23 NOTE — Progress Notes (Signed)
da

## 2018-02-02 DIAGNOSIS — J322 Chronic ethmoidal sinusitis: Secondary | ICD-10-CM | POA: Diagnosis not present

## 2018-02-02 DIAGNOSIS — J320 Chronic maxillary sinusitis: Secondary | ICD-10-CM | POA: Diagnosis not present

## 2018-02-02 DIAGNOSIS — J342 Deviated nasal septum: Secondary | ICD-10-CM | POA: Diagnosis not present

## 2018-02-02 DIAGNOSIS — J301 Allergic rhinitis due to pollen: Secondary | ICD-10-CM | POA: Diagnosis not present

## 2018-02-06 DIAGNOSIS — H524 Presbyopia: Secondary | ICD-10-CM | POA: Diagnosis not present

## 2018-02-24 ENCOUNTER — Encounter: Payer: Self-pay | Admitting: Gastroenterology

## 2018-03-06 MED FILL — LOTEMAX 0.5% GEL: 0.5 | 12 days supply | Qty: 5 | Fill #0

## 2018-03-30 MED FILL — LOTEMAX 0.5% GEL: 0.5 | 12 days supply | Qty: 5 | Fill #1

## 2018-04-27 MED FILL — LOTEMAX 0.5% GEL: 0.5 | 12 days supply | Qty: 5 | Fill #2

## 2018-05-01 ENCOUNTER — Ambulatory Visit: Payer: 59 | Admitting: Family Medicine

## 2018-07-01 MED FILL — OMEPRAZOLE 20 MG CAP: 20 | 90 days supply | Qty: 90 | Fill #1

## 2018-07-01 MED FILL — LOTEMAX 0.5% GEL: 0.5 | 12 days supply | Qty: 5 | Fill #3

## 2018-07-03 DIAGNOSIS — L821 Other seborrheic keratosis: Secondary | ICD-10-CM | POA: Diagnosis not present

## 2018-07-03 DIAGNOSIS — L814 Other melanin hyperpigmentation: Secondary | ICD-10-CM | POA: Diagnosis not present

## 2018-07-03 DIAGNOSIS — D225 Melanocytic nevi of trunk: Secondary | ICD-10-CM | POA: Diagnosis not present

## 2018-07-03 DIAGNOSIS — L820 Inflamed seborrheic keratosis: Secondary | ICD-10-CM | POA: Diagnosis not present

## 2018-07-03 DIAGNOSIS — L570 Actinic keratosis: Secondary | ICD-10-CM | POA: Diagnosis not present

## 2018-07-03 DIAGNOSIS — L918 Other hypertrophic disorders of the skin: Secondary | ICD-10-CM | POA: Diagnosis not present

## 2018-07-16 ENCOUNTER — Ambulatory Visit (INDEPENDENT_AMBULATORY_CARE_PROVIDER_SITE_OTHER): Payer: 59 | Admitting: Family Medicine

## 2018-07-16 VITALS — BP 136/88 | HR 67 | Ht 64.25 in | Wt 165.0 lb

## 2018-07-16 DIAGNOSIS — R030 Elevated blood-pressure reading, without diagnosis of hypertension: Secondary | ICD-10-CM | POA: Diagnosis not present

## 2018-07-16 DIAGNOSIS — F5104 Psychophysiologic insomnia: Secondary | ICD-10-CM

## 2018-07-16 DIAGNOSIS — F430 Acute stress reaction: Secondary | ICD-10-CM

## 2018-07-16 MED ORDER — TRAZODONE HCL 50 MG PO TABS: 50.0000 mg | ORAL_TABLET | Freq: Every evening | ORAL | 0 refills | Status: AC | PRN

## 2018-07-16 MED FILL — traZODone HCL 50 MG TABS: 50 | 45 days supply | Qty: 90 | Fill #0

## 2018-07-16 NOTE — Patient Instructions (Addendum)
GOOD LUCK Britini!!  It's been a pleasure serving you for as long as we did!!    Please check your blood pressure at home and write it down.  Please make sure you are sitting quietly for 15 to 20 minutes prior to checking it.  This elevated blood pressure is likely due to not sleeping well as well as the acute stress you are under with moving and all.  Please take the trazodone and just start with one half a tablet at night before bedtime to see effects and may go up to 2 tablets at night as needed.  If it does not work well for you, please just call and let us know and we can always call in the Lunesta 2 mg prescription for you  If you have insomnia or difficulty sleeping, this information is for you:  - Avoid caffeinated beverages after lunch,  no alcoholic beverages,  no eating within 2-3 hours of lying down,  avoid exposure to blue light before bed,  avoid daytime naps, and  needs to maintain a regular sleep schedule- go to sleep and wake up around the same time every night.   - Resolve concerns or worries before entering bedroom:  Discussed relaxation techniques with patient and to keep a journal to write down fears\ worries.  I suggested seeing a counselor for CBT.   - Recommend patient meditate or do deep breathing exercises to help relax.   Incorporate the use of white noise machines or listen to "sleep meditation music", or recordings of guided meditations for sleep from YouTube which are free, such as  "guided meditation for detachment from over thinking"  by Mayford Knife.

## 2018-07-16 NOTE — Progress Notes (Signed)
Pt here for an acute care OV today   Impression and Recommendations:    1. Acute reaction to situational stress   2. Psychophysiological insomnia   3. Elevated blood pressure reading     1. Insomnia - Acute Reaction to Stress - Trazodone prescribed today.  See med list below.  - Educated patient about use and profile of trazodone, including risks and benefits.  Discussed that trazodone is in a class of antidepressants, and can assist with insomnia, especially insomnia with an anxiety component.  - May consider Lunesta in the future if needed.  Patient prescribed Lunesta in the past and tolerated it well without S-E. - Discussed that Johnnye Sima can be used to restore the sleep-wake cycle, but that this will not assist with stress-induced insomnia.  - Reviewed natural methods to assist sleep hygiene with patient, such as utilization of herbal teas, melatonin supplementation as directed, sleep meditation, and relaxation techniques.  2. Elevated Blood Pressure - Ambulatory blood pressure monitoring encouraged.  Check it 2-3 times per week.  Sit for 15-20 minutes quietly prior to checking, with no stimulation beforehand physically, emotionally, or through stimulants like caffeine.  - Encouraged patient to exercise regularly, especially an established cardiovascular routine.  Recommended that the patient eventually strive for at least 150 minutes of cardio per week according to the [facility].   3. Follow-Up - Encouraged patient to keep in touch with clinic as she continues the process of moving out of the area.   Meds ordered this encounter  Medications  . traZODone (DESYREL) 50 MG tablet    Sig: Take 1 tablet (50 mg total) by mouth at bedtime as needed for sleep. (May take up to 100mg  q hs)    Dispense:  90 tablet    Refill:  0    Patient states she is no longer taking Lexapro:   Medications Discontinued During This Encounter  Medication Reason  . escitalopram (LEXAPRO) 20 MG tablet  No longer needed (for PRN medications)  . ondansetron (ZOFRAN) 4 MG tablet Completed Course  . predniSONE (DELTASONE) 20 MG tablet Completed Course      Education and routine counseling performed. Handouts provided  Gross side effects, risk and benefits, and alternatives of medications and treatment plan in general discussed with patient.  Patient is aware that all medications have potential side effects and we are unable to predict every side effect or drug-drug interaction that may occur.   Patient will call with any questions prior to using medication if they have concerns.  Expresses verbal understanding and consents to current therapy and treatment regimen.  No barriers to understanding were identified.  Red flag symptoms and signs discussed in detail.  Patient expressed understanding regarding what to do in case of emergency\urgent symptoms   Please see AVS handed out to patient at the end of our visit for further patient instructions/ counseling done pertaining to today's office visit.   Return if symptoms worsen or fail to improve.     Note:  This document was prepared occasionally using Dragon voice recognition software and may include unintentional dictation errors in addition to a scribe.   This document serves as a record of services personally performed by Mellody Dance, DO. It was created on her behalf by Toni Amend, a trained medical scribe. The creation of this record is based on the scribe's personal observations and the provider's statements to them.   I have reviewed the above medical documentation for accuracy and completeness and I  concur.  Mellody Dance 07/16/18 9:39 AM    -----------------------------------------------------------------------------------------------   Subjective:    CC:  Chief Complaint  Patient presents with  . Insomnia    HPI: Kimberly Vang is a 62 y.o. female who presents to [place] at [facility] today for  issues as discussed below.  Acute Stress-Induced Insomnia Recently patient has been moving.  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  Notes everything is happening at once.  They are moving to an apartment to start out with; just bought a house in [place], [place], this weekend and closing in September.   States she has no issues falling asleep, but wakes up between 2-3 AM and can't get back to sleep beyond dozing.  Notes she is not resting.  Patient has tried to use chamomile tea, melatonin pills, Z-quil gummies, etc., and notes she always wakes up at 2-3 AM regardless.  Patient is concerned about her elevated blood pressure today but notes that she thinks this is likely also due to stress.  History of Lexapro Use Patient discontinued Lexapro and doesn't want to resume because she has so much energy now. States she was sleeping or lounging around too much on the Lexapro; "now, I feel like I'm much more active."  Does not think she needs to be on an antidepressant for now.  History of Lunesta Use Was prescribed Lunesta in the past and notes that it worked well.  States it does not make her feel hung over in the morning, and worked "perfectly."  She would take them once a week "just to catch up."   No problems updated.   Wt Readings from Last 3 Encounters:  07/16/18 165 lb (74.8 kg)  01/23/18 163 lb (73.9 kg)  01/13/18 165 lb (74.8 kg)   BP Readings from Last 3 Encounters:  07/16/18 (!) 150/79  01/23/18 126/74  12/26/17 123/84   BMI Readings from Last 3 Encounters:  07/16/18 28.10 kg/m  01/23/18 27.76 kg/m  01/13/18 28.10 kg/m     Patient Care Team    Relationship Specialty Notifications Start End  Mellody Dance, DO PCP - General Family Medicine  11/08/16   Jackquline Denmark, MD Consulting Physician Internal Medicine  12/26/17    Comment: colonoscopy;  IBS  Everett Graff, MD Consulting  Physician Obstetrics and Gynecology  12/26/17   Thornell Sartorius, MD Consulting Physician Otolaryngology  01/23/18    Comment: ENT/  seen for chronic sinusitis     Patient Active Problem List   Diagnosis Date Noted  . Adjustment disorder with mixed anxiety and depressed mood 06/30/2016    Priority: High  . Overweight (BMI 25.0-29.9) 07/13/2016    Priority: Medium  . Sleeping difficulties- due to stress 07/13/2016    Priority: Medium  . Myalgia and myositis 07/10/2016    Priority: Medium  . Chronic tension headaches 06/30/2016    Priority: Medium  . History of IBS 01/23/2018  . Chronic left maxillary sinusitis 01/23/2018  . ETD (Eustachian tube dysfunction), left 01/23/2018  . BPPV (benign paroxysmal positional vertigo), left 01/23/2018  . Acute otitis media 01/13/2018  . GERD (gastroesophageal reflux disease) 12/26/2017  . Acute non-recurrent ethmoidal sinusitis 01/06/2017  . Acute nonintractable headache 01/06/2017  . Chest pain 01/12/2016  . Abnormal stress test 01/12/2016    Past Medical history, Surgical history, Family history, Social history, Allergies and Medications have been entered into the  medical record, reviewed and changed as needed.    Current Meds  Medication Sig  . LOTEMAX 0.5 % GEL Place 1 application into both eyes 2 (two) times daily.  Marland Kitchen omeprazole (PRILOSEC) 20 MG capsule Take 1 capsule (20 mg total) by mouth daily.    Allergies:  No Known Allergies   Review of Systems: General:   Denies fever, chills, unexplained weight loss.  Optho/Auditory:   Denies visual changes, blurred vision/LOV Respiratory:   Denies wheeze, DOE more than baseline levels.   Cardiovascular:   Denies chest pain, palpitations, new onset peripheral edema  Gastrointestinal:   Denies nausea, vomiting, diarrhea, abd pain.  Genitourinary: Denies dysuria, freq/ urgency, flank pain or discharge from genitals.  Endocrine:     Denies hot or cold intolerance, polyuria,  polydipsia. Musculoskeletal:   Denies unexplained myalgias, joint swelling, unexplained arthralgias, gait problems.  Skin:  Denies new onset rash, suspicious lesions Neurological:     Denies dizziness, unexplained weakness, numbness  Psychiatric/Behavioral:   Denies mood changes, suicidal or homicidal ideations, hallucinations    Objective:   Blood pressure (!) 150/79, pulse 67, height 5' 4.25" (1.632 m), weight 165 lb (74.8 kg), last menstrual period 10/16/2011, SpO2 99 %. Body mass index is 28.1 kg/m. General:  Well Developed, well nourished, appropriate for stated age.  Neuro:  Alert and oriented,  extra-ocular muscles intact  HEENT:  Normocephalic, atraumatic, neck supple Skin:  no gross rash, warm, pink. Cardiac:  RRR, S1 S2 Respiratory:  ECTA B/L and A/P, Not using accessory muscles, speaking in full sentences- unlabored. Vascular:  Ext warm, no cyanosis apprec.; cap RF less 2 sec. Psych:  No HI/SI, judgement and insight good, Euthymic mood. Full Affect.

## 2018-07-22 ENCOUNTER — Telehealth: Payer: Self-pay | Admitting: Family Medicine

## 2018-07-22 NOTE — Telephone Encounter (Signed)
Lunesta 1 mg- 2 mg p.o. nightly- as needed sleep Dispense 90 no refill.

## 2018-07-22 NOTE — Telephone Encounter (Signed)
Patient was advised to call after OV from last week about sleep troubles. She was prescribed trazodone but doesn't like how it makes her feel and was told that she could also try Lunesta. She would like to go this route and is requesting a new prescription be sent to [facility], she would also like a call to confirm when this is done.

## 2018-07-22 NOTE — Telephone Encounter (Signed)
Patient was seen on 07/16/18, noted in AVS to call back if she would like to change to [place].  Please advise if you want to send this in or if you want me to place the order for medication - if so please advise on dosage, sig, and quantity.  Thanks. MPulliam, CMA/RT(R)

## 2018-07-23 ENCOUNTER — Other Ambulatory Visit: Payer: Self-pay

## 2018-07-23 DIAGNOSIS — Z1283 Encounter for screening for malignant neoplasm of skin: Secondary | ICD-10-CM

## 2018-07-23 DIAGNOSIS — G479 Sleep disorder, unspecified: Secondary | ICD-10-CM

## 2018-07-23 MED ORDER — ESZOPICLONE 1 MG PO TABS: 1.0000 mg | ORAL_TABLET | Freq: Every evening | ORAL | 0 refills | Status: AC | PRN

## 2018-07-23 NOTE — Progress Notes (Signed)
Per Dr. Raliegh Scarlet - Kimberly Vang RX printed signed by her and patient notified to pick up. MPulliam, CMA/RT(R)

## 2018-07-23 NOTE — Telephone Encounter (Signed)
Printed and signed by Dr. Raliegh Scarlet and patient notified to pick up. MPulliam, CMA/RT(R)

## 2018-07-27 MED FILL — ESZOPICLONE 1 MG TABLET: 1 | 90 days supply | Qty: 90 | Fill #0

## 2018-09-01 MED FILL — LOTEMAX 0.5% GEL: 0.5 | 30 days supply | Qty: 10 | Fill #0

## 2018-09-30 ENCOUNTER — Telehealth: Payer: 59 | Admitting: Family

## 2018-09-30 DIAGNOSIS — J019 Acute sinusitis, unspecified: Secondary | ICD-10-CM | POA: Diagnosis not present

## 2018-09-30 MED ORDER — DOXYCYCLINE HYCLATE 100 MG PO TABS: 100.0000 mg | ORAL_TABLET | Freq: Two times a day (BID) | ORAL | 0 refills | Status: DC

## 2018-09-30 NOTE — Progress Notes (Signed)
We are sorry that you are not feeling well.  Here is how we plan to help!  Based on what you have shared with me it looks like you have sinusitis.  Sinusitis is inflammation and infection in the sinus cavities of the head.  Based on your presentation I believe you most likely have Acute Bacterial Sinusitis.  This is an infection caused by bacteria and is treated with antibiotics. I have prescribed Doxycycline 100mg  by mouth twice a day for 10 days.a You may use an oral decongestant such as Mucinex D or if you have glaucoma or high blood pressure use plain Mucinex. Saline nasal spray help and can safely be used as often as needed for congestion.  If you develop worsening sinus pain, fever or notice severe headache and vision changes, or if symptoms are not better after completion of antibiotic, please schedule an appointment with a health care provider.    Sinus infections are not as easily transmitted as other respiratory infection, however we still recommend that you avoid close contact with loved ones, especially the very young and elderly.  Remember to wash your hands thoroughly throughout the day as this is the number one way to prevent the spread of infection!  Home Care:  Only take medications as instructed by your medical team.  Complete the entire course of an antibiotic.  Do not take these medications with alcohol.  A steam or ultrasonic humidifier can help congestion.  You can place a towel over your head and breathe in the steam from hot water coming from a faucet.  Avoid close contacts especially the very young and the elderly.  Cover your mouth when you cough or sneeze.  Always remember to wash your hands.  Get Help Right Away If:  You develop worsening fever or sinus pain.  You develop a severe head ache or visual changes.  Your symptoms persist after you have completed your treatment plan.  Make sure you  Understand these instructions.  Will watch your  condition.  Will get help right away if you are not doing well or get worse.  Your e-visit answers were reviewed by a board certified advanced clinical practitioner to complete your personal care plan.  Depending on the condition, your plan could have included both over the counter or prescription medications.  If there is a problem please reply  once you have received a response from your provider.  Your safety is important to [place].  If you have drug allergies check your prescription carefully.    You can use MyChart to ask questions about today's visit, request a non-urgent call back, or ask for a work or school excuse for 24 hours related to this e-Visit. If it has been greater than 24 hours you will need to follow up with your provider, or enter a new e-Visit to address those concerns.  You will get an e-mail in the next two days asking about your experience.  I hope that your e-visit has been valuable and will speed your recovery. Thank you for using e-visits.

## 2018-10-16 ENCOUNTER — Telehealth: Payer: 59 | Admitting: Family

## 2018-10-16 DIAGNOSIS — J028 Acute pharyngitis due to other specified organisms: Secondary | ICD-10-CM | POA: Diagnosis not present

## 2018-10-16 DIAGNOSIS — B9689 Other specified bacterial agents as the cause of diseases classified elsewhere: Secondary | ICD-10-CM

## 2018-10-16 MED ORDER — AZITHROMYCIN 250 MG PO TABS: ORAL_TABLET | ORAL | 0 refills | Status: DC

## 2018-10-16 MED ORDER — BENZONATATE 100 MG PO CAPS: 100.0000 mg | ORAL_CAPSULE | Freq: Three times a day (TID) | ORAL | 0 refills | Status: DC | PRN

## 2018-10-16 MED ORDER — PREDNISONE 5 MG PO TABS: 5.0000 mg | ORAL_TABLET | ORAL | 0 refills | Status: DC

## 2018-10-16 NOTE — Progress Notes (Signed)

## 2018-12-16 ENCOUNTER — Telehealth: Payer: 59 | Admitting: Nurse Practitioner

## 2018-12-16 DIAGNOSIS — N3000 Acute cystitis without hematuria: Secondary | ICD-10-CM

## 2018-12-16 DIAGNOSIS — N10 Acute tubulo-interstitial nephritis: Secondary | ICD-10-CM

## 2018-12-16 MED ORDER — SULFAMETHOXAZOLE-TRIMETHOPRIM 800-160 MG PO TABS: 1.0000 | ORAL_TABLET | Freq: Two times a day (BID) | ORAL | 0 refills | Status: DC

## 2018-12-16 NOTE — Progress Notes (Signed)

## 2018-12-16 NOTE — Progress Notes (Unsigned)
Based on what you shared with me it looks like you have UTI or pyelonephritis,that should be evaluated in a face to face office visit.  You will need a urinalysis and urine culture.   NOTE: If you entered your credit card information for this eVisit, you will not be charged. You may see a "hold" on your card for the $30 but that hold will drop off and you will not have a charge processed.  If you are having a true medical emergency please call 911.  If you need an urgent face to face visit, [facility] has four urgent care centers for your convenience.  If you need care fast and have a high deductible or no insurance consider:   [email or web address] to reserve your spot online an avoid wait times  [facility] [address], [address] [place], [place] [address] 8 am to 8 pm Monday-Friday 10 am to 4 pm Saturday-Sunday *Across the street from [employer]  [place], [address] 8 am to 5 pm Monday-Friday * In the [facility] on the [facility]   The following sites will take your  insurance:  . [facility] [place] a Provider at this Location  [address] [place], [place] [address] . 10 am to 8 pm Monday-Friday . 12 pm to 8 pm Saturday-Sunday   . [facility] [place] a Provider at this Location  [address] [place], [place] [address] . 8 am to 8 pm Monday-Friday . 9 am to 6 pm Saturday . 11 am to 6 pm Sunday   . [facility] [place] Get Driving Directions  [address] [place], [place] [address] . 8 am to 8 pm Monday-Friday . 8 am to 4 pm Saturday-Sunday   Your e-visit answers were reviewed by a board certified advanced clinical practitioner to complete your personal care plan.

## 2019-01-08 MED FILL — SHIPPING COST: 1 days supply | Qty: 1 | Fill #0

## 2019-01-08 MED FILL — LOTEMAX 0.5% GEL: 0.5 | 30 days supply | Qty: 10 | Fill #1

## 2019-12-17 DIAGNOSIS — Z9189 Other specified personal risk factors, not elsewhere classified: Secondary | ICD-10-CM | POA: Diagnosis not present

## 2019-12-17 DIAGNOSIS — Z13220 Encounter for screening for lipoid disorders: Secondary | ICD-10-CM | POA: Diagnosis not present

## 2019-12-17 DIAGNOSIS — Z1159 Encounter for screening for other viral diseases: Secondary | ICD-10-CM | POA: Diagnosis not present

## 2019-12-17 DIAGNOSIS — Z0000 Encounter for general adult medical examination without abnormal findings: Secondary | ICD-10-CM | POA: Diagnosis not present

## 2019-12-24 DIAGNOSIS — Z0000 Encounter for general adult medical examination without abnormal findings: Secondary | ICD-10-CM | POA: Diagnosis not present

## 2019-12-24 DIAGNOSIS — F419 Anxiety disorder, unspecified: Secondary | ICD-10-CM | POA: Diagnosis not present

## 2019-12-24 DIAGNOSIS — K5909 Other constipation: Secondary | ICD-10-CM | POA: Diagnosis not present

## 2019-12-24 DIAGNOSIS — R002 Palpitations: Secondary | ICD-10-CM | POA: Diagnosis not present

## 2019-12-24 DIAGNOSIS — E785 Hyperlipidemia, unspecified: Secondary | ICD-10-CM | POA: Diagnosis not present

## 2019-12-24 DIAGNOSIS — E663 Overweight: Secondary | ICD-10-CM | POA: Diagnosis not present

## 2020-02-18 DIAGNOSIS — H524 Presbyopia: Secondary | ICD-10-CM | POA: Diagnosis not present

## 2020-02-18 DIAGNOSIS — H52223 Regular astigmatism, bilateral: Secondary | ICD-10-CM | POA: Diagnosis not present

## 2020-02-18 DIAGNOSIS — H3561 Retinal hemorrhage, right eye: Secondary | ICD-10-CM | POA: Diagnosis not present

## 2020-02-18 DIAGNOSIS — H5203 Hypermetropia, bilateral: Secondary | ICD-10-CM | POA: Diagnosis not present

## 2020-02-19 DIAGNOSIS — Z23 Encounter for immunization: Secondary | ICD-10-CM | POA: Diagnosis not present

## 2020-02-21 DIAGNOSIS — M7051 Other bursitis of knee, right knee: Secondary | ICD-10-CM | POA: Diagnosis not present

## 2020-02-21 DIAGNOSIS — M25561 Pain in right knee: Secondary | ICD-10-CM | POA: Diagnosis not present

## 2020-02-21 DIAGNOSIS — R399 Unspecified symptoms and signs involving the genitourinary system: Secondary | ICD-10-CM | POA: Diagnosis not present

## 2020-02-25 DIAGNOSIS — E663 Overweight: Secondary | ICD-10-CM | POA: Diagnosis not present

## 2020-02-25 DIAGNOSIS — F419 Anxiety disorder, unspecified: Secondary | ICD-10-CM | POA: Diagnosis not present

## 2020-02-25 DIAGNOSIS — R002 Palpitations: Secondary | ICD-10-CM | POA: Diagnosis not present

## 2020-02-29 DIAGNOSIS — M25561 Pain in right knee: Secondary | ICD-10-CM | POA: Diagnosis not present

## 2020-02-29 DIAGNOSIS — M6281 Muscle weakness (generalized): Secondary | ICD-10-CM | POA: Diagnosis not present

## 2020-03-18 DIAGNOSIS — Z23 Encounter for immunization: Secondary | ICD-10-CM | POA: Diagnosis not present

## 2020-05-26 DIAGNOSIS — R030 Elevated blood-pressure reading, without diagnosis of hypertension: Secondary | ICD-10-CM | POA: Diagnosis not present

## 2020-05-26 DIAGNOSIS — E663 Overweight: Secondary | ICD-10-CM | POA: Diagnosis not present

## 2020-08-11 DIAGNOSIS — H524 Presbyopia: Secondary | ICD-10-CM | POA: Diagnosis not present

## 2020-08-11 DIAGNOSIS — H02834 Dermatochalasis of left upper eyelid: Secondary | ICD-10-CM | POA: Diagnosis not present

## 2020-08-11 DIAGNOSIS — H04123 Dry eye syndrome of bilateral lacrimal glands: Secondary | ICD-10-CM | POA: Diagnosis not present

## 2020-08-11 DIAGNOSIS — H2513 Age-related nuclear cataract, bilateral: Secondary | ICD-10-CM | POA: Diagnosis not present

## 2020-10-13 DIAGNOSIS — H02834 Dermatochalasis of left upper eyelid: Secondary | ICD-10-CM | POA: Diagnosis not present

## 2020-10-13 DIAGNOSIS — Z411 Encounter for cosmetic surgery: Secondary | ICD-10-CM | POA: Diagnosis not present

## 2020-10-13 DIAGNOSIS — H04123 Dry eye syndrome of bilateral lacrimal glands: Secondary | ICD-10-CM | POA: Diagnosis not present

## 2020-10-13 DIAGNOSIS — H02831 Dermatochalasis of right upper eyelid: Secondary | ICD-10-CM | POA: Diagnosis not present

## 2020-10-26 ENCOUNTER — Telehealth: Payer: 59 | Admitting: Family

## 2020-10-26 DIAGNOSIS — M549 Dorsalgia, unspecified: Secondary | ICD-10-CM | POA: Diagnosis not present

## 2020-10-26 MED ORDER — CYCLOBENZAPRINE HCL 10 MG PO TABS: 10.0000 mg | ORAL_TABLET | Freq: Three times a day (TID) | ORAL | 0 refills | Status: AC | PRN

## 2020-10-26 MED ORDER — NAPROXEN 500 MG PO TABS: 500.0000 mg | ORAL_TABLET | Freq: Two times a day (BID) | ORAL | 0 refills | Status: AC

## 2020-10-26 NOTE — Progress Notes (Signed)

## 2020-12-03 ENCOUNTER — Telehealth: Payer: 59 | Admitting: Family

## 2020-12-03 DIAGNOSIS — B9689 Other specified bacterial agents as the cause of diseases classified elsewhere: Secondary | ICD-10-CM

## 2020-12-03 DIAGNOSIS — J019 Acute sinusitis, unspecified: Secondary | ICD-10-CM

## 2020-12-03 MED ORDER — AMOXICILLIN-POT CLAVULANATE 875-125 MG PO TABS: 1.0000 | ORAL_TABLET | Freq: Two times a day (BID) | ORAL | 0 refills | Status: DC

## 2020-12-03 NOTE — Progress Notes (Signed)

## 2021-01-02 DIAGNOSIS — Z13220 Encounter for screening for lipoid disorders: Secondary | ICD-10-CM | POA: Diagnosis not present

## 2021-01-02 DIAGNOSIS — Z9189 Other specified personal risk factors, not elsewhere classified: Secondary | ICD-10-CM | POA: Diagnosis not present

## 2021-01-02 DIAGNOSIS — Z0000 Encounter for general adult medical examination without abnormal findings: Secondary | ICD-10-CM | POA: Diagnosis not present

## 2021-01-05 DIAGNOSIS — Z1231 Encounter for screening mammogram for malignant neoplasm of breast: Secondary | ICD-10-CM | POA: Diagnosis not present

## 2021-01-05 DIAGNOSIS — K5909 Other constipation: Secondary | ICD-10-CM | POA: Diagnosis not present

## 2021-01-05 DIAGNOSIS — Z0000 Encounter for general adult medical examination without abnormal findings: Secondary | ICD-10-CM | POA: Diagnosis not present

## 2021-01-05 DIAGNOSIS — R7301 Impaired fasting glucose: Secondary | ICD-10-CM | POA: Diagnosis not present

## 2021-01-05 DIAGNOSIS — E559 Vitamin D deficiency, unspecified: Secondary | ICD-10-CM | POA: Diagnosis not present

## 2021-01-05 DIAGNOSIS — E785 Hyperlipidemia, unspecified: Secondary | ICD-10-CM | POA: Diagnosis not present

## 2021-02-21 ENCOUNTER — Other Ambulatory Visit (HOSPITAL_COMMUNITY): Payer: Self-pay

## 2021-02-21 DIAGNOSIS — K589 Irritable bowel syndrome without diarrhea: Secondary | ICD-10-CM | POA: Diagnosis not present

## 2021-02-21 DIAGNOSIS — K5900 Constipation, unspecified: Secondary | ICD-10-CM | POA: Diagnosis not present

## 2021-02-27 ENCOUNTER — Other Ambulatory Visit (HOSPITAL_BASED_OUTPATIENT_CLINIC_OR_DEPARTMENT_OTHER): Payer: Self-pay

## 2021-03-02 DIAGNOSIS — Z1231 Encounter for screening mammogram for malignant neoplasm of breast: Secondary | ICD-10-CM | POA: Diagnosis not present

## 2021-03-16 ENCOUNTER — Other Ambulatory Visit (HOSPITAL_COMMUNITY): Payer: Self-pay

## 2021-03-16 DIAGNOSIS — Z780 Asymptomatic menopausal state: Secondary | ICD-10-CM | POA: Diagnosis not present

## 2021-03-16 DIAGNOSIS — Z01419 Encounter for gynecological examination (general) (routine) without abnormal findings: Secondary | ICD-10-CM | POA: Diagnosis not present

## 2021-03-16 DIAGNOSIS — M8580 Other specified disorders of bone density and structure, unspecified site: Secondary | ICD-10-CM | POA: Diagnosis not present

## 2021-03-16 DIAGNOSIS — N952 Postmenopausal atrophic vaginitis: Secondary | ICD-10-CM | POA: Diagnosis not present

## 2021-03-16 MED ORDER — ESTRADIOL 0.1 MG/GM VA CREA
TOPICAL_CREAM | VAGINAL | 3 refills | Status: DC
  Filled 2021-03-16: qty 42.5, 30d supply, fill #0

## 2021-03-19 DIAGNOSIS — E663 Overweight: Secondary | ICD-10-CM | POA: Diagnosis not present

## 2021-03-19 DIAGNOSIS — R682 Dry mouth, unspecified: Secondary | ICD-10-CM | POA: Diagnosis not present

## 2021-03-19 DIAGNOSIS — R002 Palpitations: Secondary | ICD-10-CM | POA: Diagnosis not present

## 2021-03-19 DIAGNOSIS — F419 Anxiety disorder, unspecified: Secondary | ICD-10-CM | POA: Diagnosis not present

## 2021-03-21 ENCOUNTER — Other Ambulatory Visit (HOSPITAL_COMMUNITY): Payer: Self-pay

## 2021-05-17 ENCOUNTER — Other Ambulatory Visit (HOSPITAL_COMMUNITY): Payer: Self-pay

## 2021-05-17 DIAGNOSIS — F419 Anxiety disorder, unspecified: Secondary | ICD-10-CM | POA: Diagnosis not present

## 2021-05-17 DIAGNOSIS — F321 Major depressive disorder, single episode, moderate: Secondary | ICD-10-CM | POA: Diagnosis not present

## 2021-05-17 MED ORDER — PROPRANOLOL HCL 10 MG PO TABS
10.0000 mg | ORAL_TABLET | Freq: Three times a day (TID) | ORAL | 0 refills | Status: DC | PRN
  Filled 2021-05-17: qty 90, 30d supply, fill #0

## 2021-05-17 MED ORDER — FLUOXETINE HCL 20 MG PO CAPS
20.0000 mg | ORAL_CAPSULE | Freq: Every day | ORAL | 0 refills | Status: AC
  Filled 2021-05-17: qty 90, 90d supply, fill #0

## 2021-05-19 ENCOUNTER — Other Ambulatory Visit (HOSPITAL_COMMUNITY): Payer: Self-pay

## 2021-05-21 ENCOUNTER — Other Ambulatory Visit (HOSPITAL_COMMUNITY): Payer: Self-pay

## 2021-05-21 DIAGNOSIS — R140 Abdominal distension (gaseous): Secondary | ICD-10-CM | POA: Diagnosis not present

## 2021-05-21 DIAGNOSIS — K219 Gastro-esophageal reflux disease without esophagitis: Secondary | ICD-10-CM | POA: Diagnosis not present

## 2021-05-21 DIAGNOSIS — K5909 Other constipation: Secondary | ICD-10-CM | POA: Diagnosis not present

## 2021-05-21 DIAGNOSIS — K589 Irritable bowel syndrome without diarrhea: Secondary | ICD-10-CM | POA: Diagnosis not present

## 2021-05-21 MED ORDER — LUBIPROSTONE 8 MCG PO CAPS
ORAL_CAPSULE | ORAL | 0 refills | Status: DC
  Filled 2021-05-21: qty 60, 30d supply, fill #0

## 2021-05-21 MED ORDER — OMEPRAZOLE 20 MG PO CPDR
DELAYED_RELEASE_CAPSULE | ORAL | 3 refills | Status: DC
  Filled 2021-05-21: qty 90, 45d supply, fill #0
  Filled 2021-09-01: qty 90, 45d supply, fill #1
  Filled 2021-12-15: qty 90, 45d supply, fill #2
  Filled 2022-03-01: qty 90, 45d supply, fill #3

## 2021-05-22 ENCOUNTER — Other Ambulatory Visit (HOSPITAL_COMMUNITY): Payer: Self-pay

## 2021-05-23 ENCOUNTER — Other Ambulatory Visit (HOSPITAL_COMMUNITY): Payer: Self-pay

## 2021-06-13 ENCOUNTER — Other Ambulatory Visit (HOSPITAL_COMMUNITY): Payer: Self-pay

## 2021-06-13 MED ORDER — FLUOXETINE HCL 40 MG PO CAPS
40.0000 mg | ORAL_CAPSULE | Freq: Every day | ORAL | 0 refills | Status: AC
  Filled 2021-06-13: qty 90, 90d supply, fill #0

## 2021-07-02 ENCOUNTER — Other Ambulatory Visit (HOSPITAL_COMMUNITY): Payer: Self-pay

## 2021-07-02 MED ORDER — PROPRANOLOL HCL 10 MG PO TABS
10.0000 mg | ORAL_TABLET | Freq: Three times a day (TID) | ORAL | 0 refills | Status: DC | PRN
  Filled 2021-07-02: qty 90, 30d supply, fill #0

## 2021-07-03 ENCOUNTER — Other Ambulatory Visit (HOSPITAL_COMMUNITY): Payer: Self-pay

## 2021-07-03 MED ORDER — LUBIPROSTONE 8 MCG PO CAPS
ORAL_CAPSULE | ORAL | 0 refills | Status: AC
  Filled 2021-07-03: qty 180, 90d supply, fill #0

## 2021-07-04 ENCOUNTER — Other Ambulatory Visit (HOSPITAL_COMMUNITY): Payer: Self-pay

## 2021-07-25 ENCOUNTER — Other Ambulatory Visit (HOSPITAL_COMMUNITY): Payer: Self-pay

## 2021-07-25 DIAGNOSIS — F419 Anxiety disorder, unspecified: Secondary | ICD-10-CM | POA: Diagnosis not present

## 2021-07-25 MED ORDER — CITALOPRAM HYDROBROMIDE 20 MG PO TABS
ORAL_TABLET | ORAL | 0 refills | Status: DC
  Filled 2021-07-25: qty 90, 90d supply, fill #0

## 2021-07-25 MED ORDER — CYCLOBENZAPRINE HCL 10 MG PO TABS
10.0000 mg | ORAL_TABLET | Freq: Three times a day (TID) | ORAL | 0 refills | Status: DC | PRN
  Filled 2021-07-25: qty 60, 20d supply, fill #0

## 2021-07-31 DIAGNOSIS — N3001 Acute cystitis with hematuria: Secondary | ICD-10-CM | POA: Diagnosis not present

## 2021-07-31 DIAGNOSIS — R399 Unspecified symptoms and signs involving the genitourinary system: Secondary | ICD-10-CM | POA: Diagnosis not present

## 2021-08-14 ENCOUNTER — Telehealth: Payer: 59 | Admitting: Physician Assistant

## 2021-08-14 DIAGNOSIS — J019 Acute sinusitis, unspecified: Secondary | ICD-10-CM

## 2021-08-14 DIAGNOSIS — B9689 Other specified bacterial agents as the cause of diseases classified elsewhere: Secondary | ICD-10-CM

## 2021-08-14 MED ORDER — AMOXICILLIN-POT CLAVULANATE 875-125 MG PO TABS: 1.0000 | ORAL_TABLET | Freq: Two times a day (BID) | ORAL | 0 refills | Status: AC

## 2021-08-14 NOTE — Progress Notes (Signed)

## 2021-08-14 NOTE — Progress Notes (Signed)
I have spent 5 minutes in review of e-visit questionnaire, review and updating patient chart, medical decision making and response to patient.   Joanie Duprey Cody Janellie Tennison, PA-C    

## 2021-08-22 DIAGNOSIS — K581 Irritable bowel syndrome with constipation: Secondary | ICD-10-CM | POA: Diagnosis not present

## 2021-08-22 DIAGNOSIS — R140 Abdominal distension (gaseous): Secondary | ICD-10-CM | POA: Diagnosis not present

## 2021-08-22 DIAGNOSIS — K219 Gastro-esophageal reflux disease without esophagitis: Secondary | ICD-10-CM | POA: Diagnosis not present

## 2021-08-22 DIAGNOSIS — K5909 Other constipation: Secondary | ICD-10-CM | POA: Diagnosis not present

## 2021-09-03 ENCOUNTER — Other Ambulatory Visit (HOSPITAL_COMMUNITY): Payer: Self-pay

## 2021-09-14 ENCOUNTER — Other Ambulatory Visit: Payer: Self-pay

## 2021-09-14 DIAGNOSIS — Z780 Asymptomatic menopausal state: Secondary | ICD-10-CM | POA: Diagnosis not present

## 2021-09-14 DIAGNOSIS — M8589 Other specified disorders of bone density and structure, multiple sites: Secondary | ICD-10-CM | POA: Diagnosis not present

## 2021-10-08 ENCOUNTER — Other Ambulatory Visit (HOSPITAL_COMMUNITY): Payer: Self-pay

## 2021-10-09 ENCOUNTER — Other Ambulatory Visit (HOSPITAL_COMMUNITY): Payer: Self-pay

## 2021-10-09 MED ORDER — PROPRANOLOL HCL 10 MG PO TABS
10.0000 mg | ORAL_TABLET | Freq: Three times a day (TID) | ORAL | 2 refills | Status: DC | PRN
  Filled 2021-10-09: qty 90, 30d supply, fill #0
  Filled 2021-12-15: qty 90, 30d supply, fill #1
  Filled 2022-03-01: qty 90, 30d supply, fill #2

## 2021-10-13 ENCOUNTER — Other Ambulatory Visit (HOSPITAL_COMMUNITY): Payer: Self-pay

## 2021-10-26 DIAGNOSIS — H02834 Dermatochalasis of left upper eyelid: Secondary | ICD-10-CM | POA: Diagnosis not present

## 2021-10-26 DIAGNOSIS — H524 Presbyopia: Secondary | ICD-10-CM | POA: Diagnosis not present

## 2021-10-26 DIAGNOSIS — H2513 Age-related nuclear cataract, bilateral: Secondary | ICD-10-CM | POA: Diagnosis not present

## 2021-10-26 DIAGNOSIS — H02831 Dermatochalasis of right upper eyelid: Secondary | ICD-10-CM | POA: Diagnosis not present

## 2021-11-23 DIAGNOSIS — H02834 Dermatochalasis of left upper eyelid: Secondary | ICD-10-CM | POA: Diagnosis not present

## 2021-11-23 DIAGNOSIS — Z411 Encounter for cosmetic surgery: Secondary | ICD-10-CM | POA: Diagnosis not present

## 2021-11-23 DIAGNOSIS — H02831 Dermatochalasis of right upper eyelid: Secondary | ICD-10-CM | POA: Diagnosis not present

## 2021-12-15 ENCOUNTER — Other Ambulatory Visit (HOSPITAL_COMMUNITY): Payer: Self-pay

## 2021-12-17 ENCOUNTER — Other Ambulatory Visit (HOSPITAL_COMMUNITY): Payer: Self-pay

## 2021-12-17 MED ORDER — CITALOPRAM HYDROBROMIDE 20 MG PO TABS
20.0000 mg | ORAL_TABLET | Freq: Every day | ORAL | 0 refills | Status: AC
  Filled 2021-12-17: qty 90, 90d supply, fill #0

## 2021-12-19 ENCOUNTER — Other Ambulatory Visit (HOSPITAL_COMMUNITY): Payer: Self-pay

## 2022-01-22 ENCOUNTER — Other Ambulatory Visit (HOSPITAL_COMMUNITY): Payer: Self-pay

## 2022-01-22 MED ORDER — ESTRADIOL 0.1 MG/GM VA CREA
TOPICAL_CREAM | VAGINAL | 3 refills | Status: DC
  Filled 2022-01-22: qty 42.5, 90d supply, fill #0

## 2022-02-01 ENCOUNTER — Other Ambulatory Visit (HOSPITAL_COMMUNITY): Payer: Self-pay

## 2022-02-01 MED ORDER — TOPIRAMATE 25 MG PO TABS
ORAL_TABLET | ORAL | 0 refills | Status: AC
  Filled 2022-02-01: qty 180, 90d supply, fill #0

## 2022-02-01 MED ORDER — PHENTERMINE HCL 15 MG PO CAPS
ORAL_CAPSULE | ORAL | 0 refills | Status: AC
  Filled 2022-02-01: qty 30, 30d supply, fill #0

## 2022-02-04 ENCOUNTER — Other Ambulatory Visit (HOSPITAL_COMMUNITY): Payer: Self-pay

## 2022-03-01 ENCOUNTER — Other Ambulatory Visit (HOSPITAL_COMMUNITY): Payer: Self-pay

## 2022-03-01 MED ORDER — CITALOPRAM HYDROBROMIDE 40 MG PO TABS
40.0000 mg | ORAL_TABLET | Freq: Every day | ORAL | 3 refills | Status: DC
  Filled 2022-03-01: qty 90, 90d supply, fill #0
  Filled 2022-05-20: qty 90, 90d supply, fill #1
  Filled 2022-09-16: qty 90, 90d supply, fill #2

## 2022-05-20 ENCOUNTER — Other Ambulatory Visit (HOSPITAL_COMMUNITY): Payer: Self-pay

## 2022-05-20 MED ORDER — OMEPRAZOLE 20 MG PO CPDR
DELAYED_RELEASE_CAPSULE | ORAL | 0 refills | Status: DC
  Filled 2022-05-20: qty 90, 90d supply, fill #0

## 2022-06-29 ENCOUNTER — Other Ambulatory Visit (HOSPITAL_COMMUNITY): Payer: Self-pay

## 2022-07-01 ENCOUNTER — Other Ambulatory Visit (HOSPITAL_COMMUNITY): Payer: Self-pay

## 2022-07-01 MED ORDER — PROPRANOLOL HCL 10 MG PO TABS
10.0000 mg | ORAL_TABLET | Freq: Three times a day (TID) | ORAL | 0 refills | Status: DC | PRN
  Filled 2022-07-01: qty 90, 30d supply, fill #0

## 2022-09-16 ENCOUNTER — Other Ambulatory Visit (HOSPITAL_COMMUNITY): Payer: Self-pay

## 2022-09-16 MED ORDER — PROPRANOLOL HCL 10 MG PO TABS
10.0000 mg | ORAL_TABLET | Freq: Three times a day (TID) | ORAL | 0 refills | Status: DC | PRN
  Filled 2022-09-16: qty 90, 30d supply, fill #0

## 2022-09-17 ENCOUNTER — Other Ambulatory Visit (HOSPITAL_COMMUNITY): Payer: Self-pay

## 2022-10-04 ENCOUNTER — Other Ambulatory Visit (HOSPITAL_COMMUNITY): Payer: Self-pay

## 2022-12-30 ENCOUNTER — Other Ambulatory Visit: Payer: Self-pay

## 2022-12-30 ENCOUNTER — Other Ambulatory Visit (HOSPITAL_COMMUNITY): Payer: Self-pay

## 2022-12-30 MED ORDER — PROPRANOLOL HCL 10 MG PO TABS
10.0000 mg | ORAL_TABLET | Freq: Three times a day (TID) | ORAL | 0 refills | Status: AC | PRN
  Filled 2022-12-30: qty 90, 30d supply, fill #0

## 2023-01-15 ENCOUNTER — Other Ambulatory Visit (HOSPITAL_COMMUNITY): Payer: Self-pay

## 2023-01-15 ENCOUNTER — Other Ambulatory Visit: Payer: Self-pay

## 2023-01-15 MED ORDER — ESTRADIOL 0.1 MG/GM VA CREA
TOPICAL_CREAM | VAGINAL | 3 refills | Status: AC
  Filled 2023-01-15: qty 42.5, 40d supply, fill #0

## 2023-01-16 ENCOUNTER — Other Ambulatory Visit: Payer: Self-pay

## 2023-01-18 ENCOUNTER — Other Ambulatory Visit (HOSPITAL_COMMUNITY): Payer: Self-pay

## 2023-01-21 ENCOUNTER — Encounter (HOSPITAL_COMMUNITY): Payer: Self-pay

## 2023-01-21 ENCOUNTER — Other Ambulatory Visit (HOSPITAL_COMMUNITY): Payer: Self-pay

## 2023-01-23 ENCOUNTER — Other Ambulatory Visit (HOSPITAL_COMMUNITY): Payer: Self-pay

## 2023-02-07 ENCOUNTER — Other Ambulatory Visit (HOSPITAL_COMMUNITY): Payer: Self-pay

## 2023-02-07 MED ORDER — OMEPRAZOLE 20 MG PO CPDR
DELAYED_RELEASE_CAPSULE | ORAL | 3 refills | Status: AC
  Filled 2023-02-07: qty 90, 90d supply, fill #0
  Filled 2023-05-06: qty 90, 90d supply, fill #1
  Filled 2023-08-18: qty 90, 90d supply, fill #2
  Filled 2023-10-27: qty 90, 90d supply, fill #3

## 2023-02-07 MED ORDER — CITALOPRAM HYDROBROMIDE 20 MG PO TABS
20.0000 mg | ORAL_TABLET | Freq: Every day | ORAL | 3 refills | Status: AC
  Filled 2023-02-07: qty 90, 90d supply, fill #0
  Filled 2023-05-06: qty 90, 90d supply, fill #1

## 2023-02-10 ENCOUNTER — Other Ambulatory Visit (HOSPITAL_COMMUNITY): Payer: Self-pay

## 2023-02-10 ENCOUNTER — Other Ambulatory Visit: Payer: Self-pay

## 2023-02-10 MED ORDER — CYCLOBENZAPRINE HCL 10 MG PO TABS
10.0000 mg | ORAL_TABLET | Freq: Three times a day (TID) | ORAL | 1 refills | Status: AC | PRN
  Filled 2023-02-10: qty 60, 20d supply, fill #0

## 2023-04-16 ENCOUNTER — Other Ambulatory Visit (HOSPITAL_COMMUNITY): Payer: Self-pay

## 2023-04-18 ENCOUNTER — Other Ambulatory Visit (HOSPITAL_COMMUNITY): Payer: Self-pay

## 2023-04-18 MED ORDER — PROPRANOLOL HCL 10 MG PO TABS
10.0000 mg | ORAL_TABLET | Freq: Every day | ORAL | 5 refills | Status: AC | PRN
  Filled 2023-04-18: qty 90, 90d supply, fill #0
  Filled 2023-06-28: qty 90, 90d supply, fill #1
  Filled 2023-09-16: qty 90, 90d supply, fill #2
  Filled 2023-12-28: qty 90, 90d supply, fill #3

## 2023-05-06 ENCOUNTER — Other Ambulatory Visit (HOSPITAL_COMMUNITY): Payer: Self-pay

## 2023-05-07 ENCOUNTER — Other Ambulatory Visit (HOSPITAL_COMMUNITY): Payer: Self-pay

## 2023-05-07 MED ORDER — CITALOPRAM HYDROBROMIDE 20 MG PO TABS
20.0000 mg | ORAL_TABLET | Freq: Every day | ORAL | 1 refills | Status: AC
  Filled 2023-05-07: qty 90, 90d supply, fill #0

## 2023-05-07 MED ORDER — OMEPRAZOLE 20 MG PO CPDR
20.0000 mg | DELAYED_RELEASE_CAPSULE | Freq: Every day | ORAL | 1 refills | Status: AC
  Filled 2023-05-07: qty 90, 90d supply, fill #0

## 2023-05-09 ENCOUNTER — Other Ambulatory Visit (HOSPITAL_COMMUNITY): Payer: Self-pay

## 2023-05-17 ENCOUNTER — Other Ambulatory Visit (HOSPITAL_COMMUNITY): Payer: Self-pay

## 2023-06-09 ENCOUNTER — Other Ambulatory Visit (HOSPITAL_COMMUNITY): Payer: Self-pay

## 2023-06-09 ENCOUNTER — Other Ambulatory Visit: Payer: Self-pay

## 2023-06-09 MED ORDER — DULOXETINE HCL 20 MG PO CPEP
20.0000 mg | ORAL_CAPSULE | ORAL | 2 refills | Status: AC
  Filled 2023-06-09: qty 60, 37d supply, fill #0
  Filled 2023-07-16: qty 60, 30d supply, fill #1

## 2023-06-10 ENCOUNTER — Other Ambulatory Visit: Payer: Self-pay

## 2023-06-10 ENCOUNTER — Encounter: Payer: Self-pay | Admitting: Pharmacist

## 2023-06-13 ENCOUNTER — Other Ambulatory Visit (HOSPITAL_COMMUNITY): Payer: Self-pay

## 2023-06-29 ENCOUNTER — Other Ambulatory Visit (HOSPITAL_COMMUNITY): Payer: Self-pay

## 2023-06-30 ENCOUNTER — Other Ambulatory Visit: Payer: Self-pay

## 2023-07-16 ENCOUNTER — Other Ambulatory Visit (HOSPITAL_COMMUNITY): Payer: Self-pay

## 2023-08-15 ENCOUNTER — Other Ambulatory Visit (HOSPITAL_COMMUNITY): Payer: Self-pay

## 2023-08-15 ENCOUNTER — Other Ambulatory Visit: Payer: Self-pay

## 2023-08-15 MED ORDER — DULOXETINE HCL 20 MG PO CPEP
20.0000 mg | ORAL_CAPSULE | Freq: Two times a day (BID) | ORAL | 1 refills | Status: AC
  Filled 2023-08-15: qty 180, 90d supply, fill #0
  Filled 2023-12-28: qty 180, 90d supply, fill #1

## 2023-08-19 ENCOUNTER — Other Ambulatory Visit (HOSPITAL_COMMUNITY): Payer: Self-pay

## 2023-09-16 ENCOUNTER — Other Ambulatory Visit (HOSPITAL_COMMUNITY): Payer: Self-pay

## 2023-09-16 ENCOUNTER — Other Ambulatory Visit: Payer: Self-pay

## 2023-09-16 MED ORDER — DICYCLOMINE HCL 10 MG PO CAPS
10.0000 mg | ORAL_CAPSULE | Freq: Three times a day (TID) | ORAL | 1 refills | Status: AC | PRN
  Filled 2023-09-16: qty 270, 90d supply, fill #0

## 2023-09-17 ENCOUNTER — Other Ambulatory Visit (HOSPITAL_COMMUNITY): Payer: Self-pay

## 2023-10-28 ENCOUNTER — Other Ambulatory Visit: Payer: Self-pay

## 2023-11-03 ENCOUNTER — Other Ambulatory Visit (HOSPITAL_COMMUNITY): Payer: Self-pay

## 2023-11-03 MED ORDER — OMEPRAZOLE 20 MG PO CPDR
20.0000 mg | DELAYED_RELEASE_CAPSULE | Freq: Every day | ORAL | 1 refills | Status: AC
  Filled 2023-11-03: qty 90, 90d supply, fill #0

## 2023-12-29 ENCOUNTER — Other Ambulatory Visit (HOSPITAL_COMMUNITY): Payer: Self-pay

## 2024-01-28 ENCOUNTER — Other Ambulatory Visit (HOSPITAL_COMMUNITY): Payer: Self-pay

## 2024-01-28 MED ORDER — OMEPRAZOLE 20 MG PO CPDR
20.0000 mg | DELAYED_RELEASE_CAPSULE | Freq: Every day | ORAL | 0 refills | Status: AC
  Filled 2024-01-28: qty 90, 90d supply, fill #0

## 2024-01-28 MED ORDER — DULOXETINE HCL 20 MG PO CPEP
20.0000 mg | ORAL_CAPSULE | Freq: Two times a day (BID) | ORAL | 0 refills | Status: AC
  Filled 2024-06-18: qty 180, 90d supply, fill #0

## 2024-04-20 ENCOUNTER — Other Ambulatory Visit (HOSPITAL_COMMUNITY): Payer: Self-pay

## 2024-04-20 MED ORDER — OMEPRAZOLE 20 MG PO CPDR
20.0000 mg | DELAYED_RELEASE_CAPSULE | Freq: Every day | ORAL | 3 refills | Status: AC
  Filled 2024-04-20: qty 90, 90d supply, fill #0

## 2024-05-06 ENCOUNTER — Other Ambulatory Visit (HOSPITAL_COMMUNITY): Payer: Self-pay

## 2024-05-07 ENCOUNTER — Encounter: Payer: Self-pay | Admitting: Pharmacist

## 2024-05-07 ENCOUNTER — Other Ambulatory Visit (HOSPITAL_COMMUNITY): Payer: Self-pay

## 2024-05-07 ENCOUNTER — Other Ambulatory Visit: Payer: Self-pay

## 2024-05-07 MED ORDER — PROPRANOLOL HCL 10 MG PO TABS
10.0000 mg | ORAL_TABLET | Freq: Every day | ORAL | 3 refills | Status: AC
  Filled 2024-05-07: qty 90, 30d supply, fill #0
  Filled 2024-06-16: qty 90, 30d supply, fill #1

## 2024-05-10 ENCOUNTER — Other Ambulatory Visit: Payer: Self-pay

## 2024-05-10 ENCOUNTER — Other Ambulatory Visit (HOSPITAL_COMMUNITY): Payer: Self-pay

## 2024-05-10 MED ORDER — ESTRADIOL 10 MCG VA TABS
1.0000 | ORAL_TABLET | VAGINAL | 3 refills | Status: AC
  Filled 2024-05-10: qty 24, 84d supply, fill #0

## 2024-05-11 ENCOUNTER — Other Ambulatory Visit: Payer: Self-pay

## 2024-05-12 ENCOUNTER — Other Ambulatory Visit: Payer: Self-pay

## 2024-06-16 ENCOUNTER — Other Ambulatory Visit (HOSPITAL_COMMUNITY): Payer: Self-pay

## 2024-06-18 ENCOUNTER — Other Ambulatory Visit (HOSPITAL_COMMUNITY): Payer: Self-pay

## 2024-07-21 ENCOUNTER — Other Ambulatory Visit: Payer: Self-pay

## 2024-07-21 ENCOUNTER — Other Ambulatory Visit (HOSPITAL_COMMUNITY): Payer: Self-pay

## 2024-07-21 MED ORDER — OMEPRAZOLE 20 MG PO CPDR
20.0000 mg | DELAYED_RELEASE_CAPSULE | Freq: Two times a day (BID) | ORAL | 0 refills | Status: AC
  Filled 2024-07-21 (×2): qty 112, 56d supply, fill #0

## 2024-09-09 ENCOUNTER — Other Ambulatory Visit (HOSPITAL_COMMUNITY): Payer: Self-pay

## 2024-09-09 MED ORDER — OMEPRAZOLE 20 MG PO CPDR
DELAYED_RELEASE_CAPSULE | ORAL | 0 refills | Status: AC
  Filled 2024-09-09: qty 60, 30d supply, fill #0

## 2024-10-11 ENCOUNTER — Other Ambulatory Visit (HOSPITAL_COMMUNITY): Payer: Self-pay

## 2024-10-11 ENCOUNTER — Other Ambulatory Visit: Payer: Self-pay

## 2024-10-11 MED ORDER — PROPRANOLOL HCL 10 MG PO TABS
ORAL_TABLET | ORAL | 3 refills | Status: AC
  Filled 2024-10-11: qty 90, 90d supply, fill #0
  Filled 2024-12-22: qty 90, 90d supply, fill #1

## 2024-12-22 ENCOUNTER — Other Ambulatory Visit (HOSPITAL_COMMUNITY): Payer: Self-pay

## 2024-12-22 ENCOUNTER — Other Ambulatory Visit: Payer: Self-pay

## 2024-12-22 MED ORDER — OMEPRAZOLE 20 MG PO CPDR
20.0000 mg | DELAYED_RELEASE_CAPSULE | Freq: Every day | ORAL | 0 refills | Status: AC
  Filled 2024-12-22: qty 90, 90d supply, fill #0
